# Patient Record
Sex: Female | Born: 1975 | Hispanic: No | Marital: Single | Smoking: Never smoker
Health system: Southern US, Community
[De-identification: ages and names within clinical notes are randomized; demographics above are authoritative.]

## PROBLEM LIST (undated history)

## (undated) DIAGNOSIS — K219 Gastro-esophageal reflux disease without esophagitis: Secondary | ICD-10-CM

## (undated) DIAGNOSIS — F419 Anxiety disorder, unspecified: Secondary | ICD-10-CM

## (undated) DIAGNOSIS — N200 Calculus of kidney: Secondary | ICD-10-CM

## (undated) DIAGNOSIS — Z87442 Personal history of urinary calculi: Secondary | ICD-10-CM

## (undated) DIAGNOSIS — Q615 Medullary cystic kidney: Secondary | ICD-10-CM

## (undated) DIAGNOSIS — Z8489 Family history of other specified conditions: Secondary | ICD-10-CM

## (undated) HISTORY — DX: Medullary cystic kidney: Q61.5

## (undated) HISTORY — PX: TUBAL LIGATION: SHX77

## (undated) HISTORY — DX: Calculus of kidney: N20.0

---

## 2010-12-21 ENCOUNTER — Ambulatory Visit: Payer: Self-pay

## 2010-12-21 ENCOUNTER — Other Ambulatory Visit: Payer: Self-pay | Admitting: Occupational Medicine

## 2010-12-21 DIAGNOSIS — R52 Pain, unspecified: Secondary | ICD-10-CM

## 2012-11-19 ENCOUNTER — Emergency Department (HOSPITAL_COMMUNITY)
Admission: EM | Admit: 2012-11-19 | Discharge: 2012-11-19 | Disposition: A | Discharge status: Home / Self Care | Payer: Managed Care, Other (non HMO) | Attending: Emergency Medicine | Admitting: Emergency Medicine

## 2012-11-19 ENCOUNTER — Emergency Department (HOSPITAL_COMMUNITY): Payer: Managed Care, Other (non HMO)

## 2012-11-19 ENCOUNTER — Encounter (HOSPITAL_COMMUNITY): Payer: Self-pay | Admitting: Emergency Medicine

## 2012-11-19 DIAGNOSIS — R0789 Other chest pain: Secondary | ICD-10-CM | POA: Insufficient documentation

## 2012-11-19 DIAGNOSIS — R079 Chest pain, unspecified: Secondary | ICD-10-CM

## 2012-11-19 DIAGNOSIS — Z79899 Other long term (current) drug therapy: Secondary | ICD-10-CM | POA: Insufficient documentation

## 2012-11-19 LAB — CBC
HCT: 38.3 % (ref 36.0–46.0)
Hemoglobin: 12.9 g/dL (ref 12.0–15.0)
MCH: 28.2 pg (ref 26.0–34.0)
MCHC: 33.7 g/dL (ref 30.0–36.0)
MCV: 83.8 fL (ref 78.0–100.0)
Platelets: 202 10*3/uL (ref 150–400)
RBC: 4.57 MIL/uL (ref 3.87–5.11)
RDW: 12.8 % (ref 11.5–15.5)
WBC: 8.6 10*3/uL (ref 4.0–10.5)

## 2012-11-19 LAB — COMPREHENSIVE METABOLIC PANEL
ALT: 21 U/L (ref 0–35)
AST: 14 U/L (ref 0–37)
Albumin: 3.3 g/dL — ABNORMAL LOW (ref 3.5–5.2)
Alkaline Phosphatase: 68 U/L (ref 39–117)
BUN: 10 mg/dL (ref 6–23)
CO2: 28 mEq/L (ref 19–32)
Calcium: 9.1 mg/dL (ref 8.4–10.5)
Chloride: 101 mEq/L (ref 96–112)
Creatinine, Ser: 0.74 mg/dL (ref 0.50–1.10)
GFR calc Af Amer: >90 mL/min (ref >90)
GFR calc non Af Amer: >90 mL/min (ref >90)
Glucose, Bld: 96 mg/dL (ref 70–99)
Potassium: 3.7 mEq/L (ref 3.5–5.1)
Sodium: 137 mEq/L (ref 135–145)
Total Bilirubin: 0.3 mg/dL (ref 0.3–1.2)
Total Protein: 6.8 g/dL (ref 6.0–8.3)

## 2012-11-19 LAB — PROTIME-INR
INR: 0.96 (ref 0.00–1.49)
Prothrombin Time: 12.6 seconds (ref 11.6–15.2)

## 2012-11-19 LAB — D-DIMER, QUANTITATIVE: D-Dimer, Quant: <0.27 ug/mL-FEU (ref 0.00–0.48)

## 2012-11-19 LAB — TROPONIN I: Troponin I: <0.3 ng/mL (ref <0.30)

## 2012-11-19 MED ORDER — NAPROXEN 500 MG PO TABS: 500.0000 mg | ORAL_TABLET | Freq: Two times a day (BID) | ORAL | Status: DC

## 2012-11-19 MED ORDER — MORPHINE SULFATE 4 MG/ML IJ SOLN
4.0000 mg | Freq: Once | INTRAMUSCULAR | Status: AC
  Administered 2012-11-19: 4 mg via INTRAVENOUS
  Filled 2012-11-19: qty 1

## 2012-11-19 MED ORDER — SODIUM CHLORIDE 0.9 % IV SOLN
1000.0000 mL | INTRAVENOUS | Status: DC
  Administered 2012-11-19: 1000 mL via INTRAVENOUS

## 2012-11-19 MED ORDER — LANSOPRAZOLE 30 MG PO TBDP: 30.0000 mg | ORAL_TABLET | Freq: Every day | ORAL | Status: DC

## 2012-11-19 MED ORDER — ASPIRIN 81 MG PO CHEW
324.0000 mg | CHEWABLE_TABLET | Freq: Once | ORAL | Status: AC
  Administered 2012-11-19: 324 mg via ORAL
  Filled 2012-11-19: qty 4

## 2012-11-19 NOTE — ED Notes (Signed)
Assisted pt to restroom  

## 2012-11-19 NOTE — ED Provider Notes (Signed)
CSN: [id number]     Arrival date & time 11/19/12  0740 History   First MD Initiated Contact with Patient 11/19/12 [phone]     Chief Complaint  Patient presents with  . Chest Pain   HPI Patient presents emergency room with complaints of chest soreness and tightness that started last evening. Patient initially thought it might be indigestion she she tried taking an antacid. This morning when she woke up the symptoms were still persistent. The pain seems to get worse when she takes a deep breath. Being upright versus supine seems to help a little bit as well.  She denies any nausea, vomiting, diarrhea. She has no complaints of abdominal pain. She has not had any trouble with leg swelling. She has not any fevers or cough. She does not feel short of breath. Patient does not have history of heart disease. She does not have any history of a minimal is more blood clots. She does take oral contraceptive pills.  History reviewed. No pertinent past medical history. Past Surgical History  Procedure Laterality Date  . Cesarean section     History reviewed. No pertinent family history. History  Substance Use Topics  . Smoking status: Never Smoker   . Smokeless tobacco: Not on file  . Alcohol Use: No   OB History   Grav Para Term Preterm Abortions TAB SAB Ect Mult Living                 Review of Systems  All other systems reviewed and are negative.    Allergies  Cephalosporins  Home Medications   Current Outpatient Rx  Name  Route  Sig  Dispense  Refill  . lansoprazole (PREVACID SOLUTAB) 30 MG disintegrating tablet   Oral   Take 1 tablet (30 mg total) by mouth daily.   14 tablet   1   . naproxen (NAPROSYN) 500 MG tablet   Oral   Take 1 tablet (500 mg total) by mouth 2 (two) times daily.   30 tablet   0    BP 129/76  Pulse 84  Temp(Src) 98.1 F (36.7 C) (Oral)  Resp 17  Ht 5\' 4"  (1.626 m)  Wt 195 lb (88.451 kg)  BMI 33.46 kg/m2  SpO2 98%  LMP 10/29/2012 Physical Exam   Nursing note and vitals reviewed. Constitutional: She appears well-developed and well-nourished. No distress.  HENT:  Head: Normocephalic and atraumatic.  Right Ear: External ear normal.  Left Ear: External ear normal.  Eyes: Conjunctivae are normal. Right eye exhibits no discharge. Left eye exhibits no discharge. No scleral icterus.  Neck: Neck supple. No tracheal deviation present.  Cardiovascular: Normal rate, regular rhythm and intact distal pulses.   Pulmonary/Chest: Effort normal and breath sounds normal. No stridor. No respiratory distress. She has no wheezes. She has no rales.  Abdominal: Soft. Bowel sounds are normal. She exhibits no distension. There is no tenderness. There is no rebound and no guarding.  Musculoskeletal: She exhibits no edema and no tenderness.  Neurological: She is alert. She has normal strength. No sensory deficit. Cranial nerve deficit:  no gross defecits noted. She exhibits normal muscle tone. She displays no seizure activity. Coordination normal.  Skin: Skin is warm and dry. No rash noted.  Psychiatric: She has a normal mood and affect.    ED Course  EKG Normal sinus rhythm with sinus arrhythmia rate 71 Normal axis Normal intervals Normal ST-T waves No prior EKG for comparison Procedures (including critical care time)  Labs  Reviewed  COMPREHENSIVE METABOLIC PANEL - Abnormal; Notable for the following:    Albumin 3.3 (*)    All other components within normal limits  CBC  PROTIME-INR  D-DIMER, QUANTITATIVE  TROPONIN I   Dg Chest 2 View  11/19/2012   *RADIOLOGY REPORT*  Clinical Data: Chest pain.  CHEST - 2 VIEW  Comparison: None.  Findings: Minimal peribronchial thickening probably normal rather than representing bronchitis type changes.  No infiltrate, congestive heart failure or pneumothorax.  Heart size within normal limits.  IMPRESSION: Minimal peribronchial thickening.   Original Report Authenticated By: Lacy Duverney, M.D.   1. Chest pain      MDM  Doubt pulmonary embolism. Patient is not tachycardic. She is not tachypneic. Her d-dimer is negative and a low risk patient.  Doubt ACS with her minimal risk factors, persistent symptoms since last evening and a normal EKG and normal troponin.  Pericarditis is a possibility although some of the features are atypical. Gastroesophageal reflux disease are also a possibility. We'll start the patient on NSAIDs and antacids. I recommend followup with her primary Dr. Warning signs and precautions were discussed.  Celene Kras, MD 11/19/12 [phone]

## 2012-11-19 NOTE — ED Notes (Signed)
Pain also worse with movement.

## 2012-11-19 NOTE — ED Notes (Signed)
Pt c/o all over chest tigtness since last night. Worse with deep breathing. Nondiaphoretic. deneis n/v/d. Denies dizziness. Nad.

## 2014-07-12 IMAGING — CR DG CHEST 2V
2 series · 2 of 2 positions shown · non-contrast
Comparison: None.

CLINICAL DATA: Chest pain.

CHEST - 2 VIEW

[view not recorded (1 of 2)]
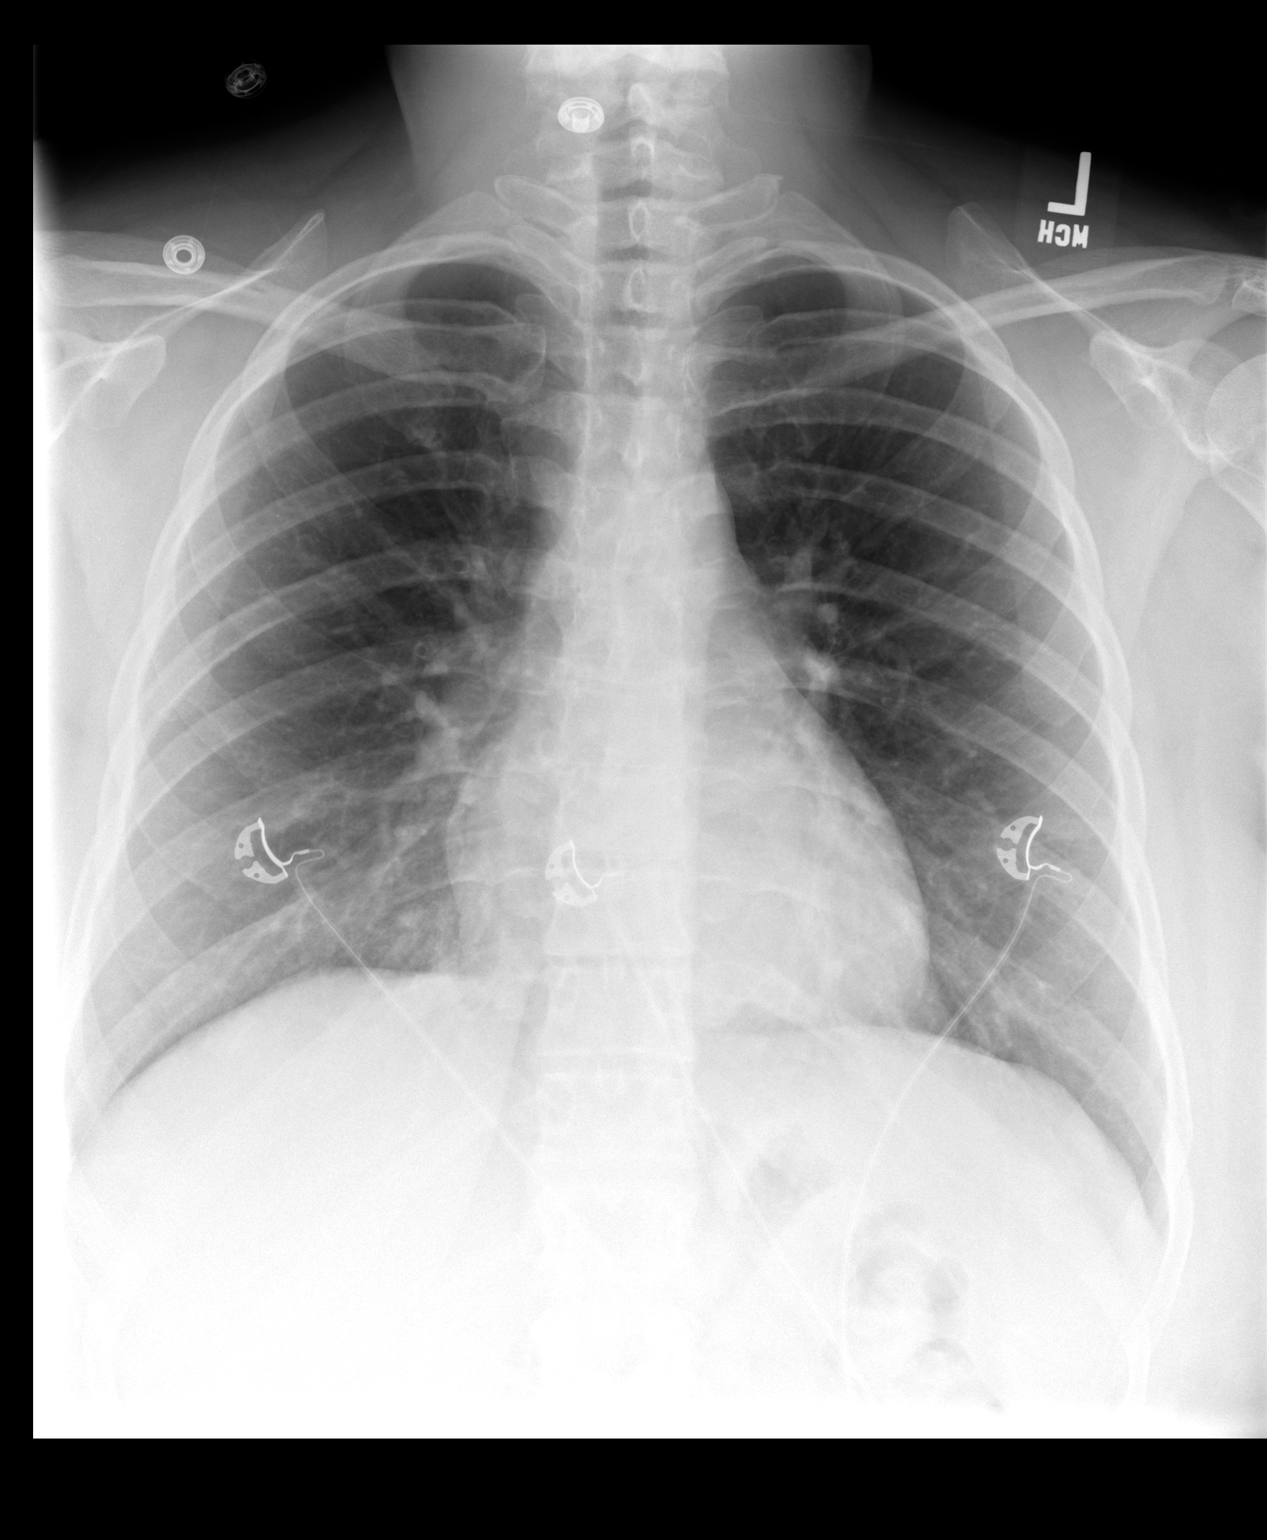

[view not recorded (2 of 2)]
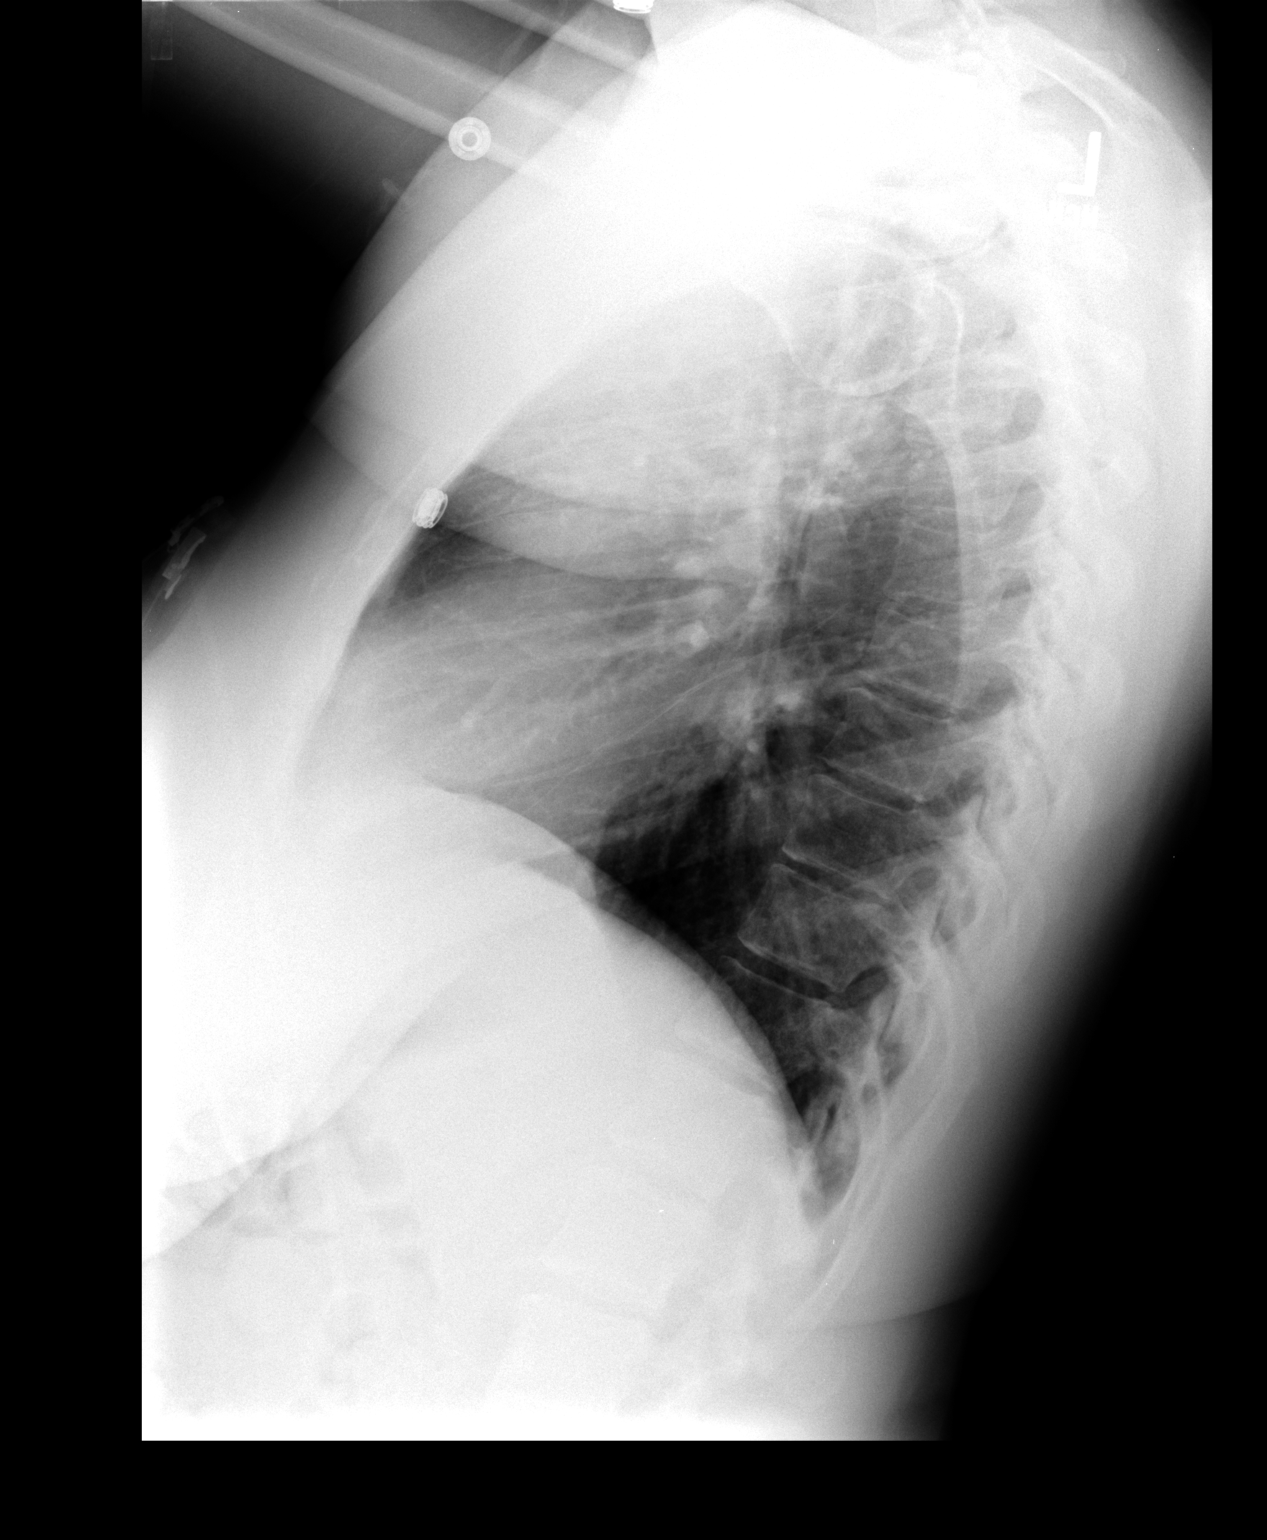

[2 of 2 positions shown; findings below may reference images not displayed]

FINDINGS: Minimal peribronchial thickening probably normal rather
than representing bronchitis type changes.

No infiltrate, congestive heart failure or pneumothorax..

Heart size within normal limits.
IMPRESSION: Minimal peribronchial thickening.

## 2015-10-11 DIAGNOSIS — Z1389 Encounter for screening for other disorder: Secondary | ICD-10-CM | POA: Diagnosis not present

## 2015-10-11 DIAGNOSIS — N342 Other urethritis: Secondary | ICD-10-CM | POA: Diagnosis not present

## 2015-10-11 DIAGNOSIS — R350 Frequency of micturition: Secondary | ICD-10-CM | POA: Diagnosis not present

## 2015-10-11 DIAGNOSIS — Z6835 Body mass index (BMI) 35.0-35.9, adult: Secondary | ICD-10-CM | POA: Diagnosis not present

## 2016-05-13 DIAGNOSIS — M545 Low back pain: Secondary | ICD-10-CM | POA: Diagnosis not present

## 2016-05-13 DIAGNOSIS — Z1389 Encounter for screening for other disorder: Secondary | ICD-10-CM | POA: Diagnosis not present

## 2016-05-13 DIAGNOSIS — Z6835 Body mass index (BMI) 35.0-35.9, adult: Secondary | ICD-10-CM | POA: Diagnosis not present

## 2016-05-13 DIAGNOSIS — N390 Urinary tract infection, site not specified: Secondary | ICD-10-CM | POA: Diagnosis not present

## 2016-05-13 DIAGNOSIS — R319 Hematuria, unspecified: Secondary | ICD-10-CM | POA: Diagnosis not present

## 2016-05-13 DIAGNOSIS — Z23 Encounter for immunization: Secondary | ICD-10-CM | POA: Diagnosis not present

## 2016-05-23 DIAGNOSIS — M5126 Other intervertebral disc displacement, lumbar region: Secondary | ICD-10-CM | POA: Diagnosis not present

## 2016-05-23 DIAGNOSIS — M4316 Spondylolisthesis, lumbar region: Secondary | ICD-10-CM | POA: Diagnosis not present

## 2016-05-23 DIAGNOSIS — R319 Hematuria, unspecified: Secondary | ICD-10-CM | POA: Diagnosis not present

## 2016-05-23 DIAGNOSIS — M2578 Osteophyte, vertebrae: Secondary | ICD-10-CM | POA: Diagnosis not present

## 2016-06-11 DIAGNOSIS — M5441 Lumbago with sciatica, right side: Secondary | ICD-10-CM | POA: Diagnosis not present

## 2016-06-11 DIAGNOSIS — Z6835 Body mass index (BMI) 35.0-35.9, adult: Secondary | ICD-10-CM | POA: Diagnosis not present

## 2016-10-08 DIAGNOSIS — M545 Low back pain: Secondary | ICD-10-CM | POA: Diagnosis not present

## 2016-10-08 DIAGNOSIS — S338XXA Sprain of other parts of lumbar spine and pelvis, initial encounter: Secondary | ICD-10-CM | POA: Diagnosis not present

## 2016-10-10 DIAGNOSIS — S338XXA Sprain of other parts of lumbar spine and pelvis, initial encounter: Secondary | ICD-10-CM | POA: Diagnosis not present

## 2016-10-10 DIAGNOSIS — M545 Low back pain: Secondary | ICD-10-CM | POA: Diagnosis not present

## 2016-10-31 DIAGNOSIS — S338XXA Sprain of other parts of lumbar spine and pelvis, initial encounter: Secondary | ICD-10-CM | POA: Diagnosis not present

## 2016-10-31 DIAGNOSIS — M545 Low back pain: Secondary | ICD-10-CM | POA: Diagnosis not present

## 2017-01-31 DIAGNOSIS — Z1389 Encounter for screening for other disorder: Secondary | ICD-10-CM | POA: Diagnosis not present

## 2017-01-31 DIAGNOSIS — Z6835 Body mass index (BMI) 35.0-35.9, adult: Secondary | ICD-10-CM | POA: Diagnosis not present

## 2017-01-31 DIAGNOSIS — M5410 Radiculopathy, site unspecified: Secondary | ICD-10-CM | POA: Diagnosis not present

## 2017-01-31 DIAGNOSIS — F4320 Adjustment disorder, unspecified: Secondary | ICD-10-CM | POA: Diagnosis not present

## 2017-01-31 DIAGNOSIS — E6609 Other obesity due to excess calories: Secondary | ICD-10-CM | POA: Diagnosis not present

## 2017-01-31 DIAGNOSIS — M545 Low back pain: Secondary | ICD-10-CM | POA: Diagnosis not present

## 2017-05-02 DIAGNOSIS — J069 Acute upper respiratory infection, unspecified: Secondary | ICD-10-CM | POA: Diagnosis not present

## 2017-05-02 DIAGNOSIS — Z6834 Body mass index (BMI) 34.0-34.9, adult: Secondary | ICD-10-CM | POA: Diagnosis not present

## 2017-05-02 DIAGNOSIS — J029 Acute pharyngitis, unspecified: Secondary | ICD-10-CM | POA: Diagnosis not present

## 2017-05-02 DIAGNOSIS — E6609 Other obesity due to excess calories: Secondary | ICD-10-CM | POA: Diagnosis not present

## 2018-01-16 DIAGNOSIS — R109 Unspecified abdominal pain: Secondary | ICD-10-CM | POA: Diagnosis not present

## 2018-01-16 DIAGNOSIS — N390 Urinary tract infection, site not specified: Secondary | ICD-10-CM | POA: Diagnosis not present

## 2018-01-16 DIAGNOSIS — N10 Acute tubulo-interstitial nephritis: Secondary | ICD-10-CM | POA: Diagnosis not present

## 2018-01-22 DIAGNOSIS — Z1389 Encounter for screening for other disorder: Secondary | ICD-10-CM | POA: Diagnosis not present

## 2018-01-22 DIAGNOSIS — Z6838 Body mass index (BMI) 38.0-38.9, adult: Secondary | ICD-10-CM | POA: Diagnosis not present

## 2018-01-22 DIAGNOSIS — Z23 Encounter for immunization: Secondary | ICD-10-CM | POA: Diagnosis not present

## 2018-01-22 DIAGNOSIS — N10 Acute tubulo-interstitial nephritis: Secondary | ICD-10-CM | POA: Diagnosis not present

## 2018-01-29 ENCOUNTER — Other Ambulatory Visit: Payer: Self-pay | Admitting: Family Medicine

## 2018-01-29 DIAGNOSIS — Z1231 Encounter for screening mammogram for malignant neoplasm of breast: Secondary | ICD-10-CM

## 2018-01-30 DIAGNOSIS — H40013 Open angle with borderline findings, low risk, bilateral: Secondary | ICD-10-CM | POA: Diagnosis not present

## 2018-09-23 DIAGNOSIS — N62 Hypertrophy of breast: Secondary | ICD-10-CM | POA: Diagnosis not present

## 2018-09-23 DIAGNOSIS — M549 Dorsalgia, unspecified: Secondary | ICD-10-CM | POA: Diagnosis not present

## 2018-09-23 DIAGNOSIS — Z1389 Encounter for screening for other disorder: Secondary | ICD-10-CM | POA: Diagnosis not present

## 2018-09-23 DIAGNOSIS — Z6839 Body mass index (BMI) 39.0-39.9, adult: Secondary | ICD-10-CM | POA: Diagnosis not present

## 2018-09-23 DIAGNOSIS — Z0000 Encounter for general adult medical examination without abnormal findings: Secondary | ICD-10-CM | POA: Diagnosis not present

## 2018-10-20 ENCOUNTER — Other Ambulatory Visit: Payer: Self-pay | Admitting: Obstetrics and Gynecology

## 2018-10-27 ENCOUNTER — Other Ambulatory Visit: Payer: Self-pay

## 2018-10-27 DIAGNOSIS — Z20822 Contact with and (suspected) exposure to covid-19: Secondary | ICD-10-CM

## 2018-10-28 ENCOUNTER — Other Ambulatory Visit: Payer: Self-pay | Admitting: Obstetrics and Gynecology

## 2018-10-29 LAB — NOVEL CORONAVIRUS, NAA: SARS-CoV-2, NAA: NOT DETECTED

## 2018-12-02 ENCOUNTER — Other Ambulatory Visit: Payer: Self-pay | Admitting: Obstetrics and Gynecology

## 2019-02-04 ENCOUNTER — Other Ambulatory Visit: Payer: Self-pay | Admitting: Obstetrics and Gynecology

## 2019-02-09 ENCOUNTER — Other Ambulatory Visit: Payer: Self-pay

## 2019-02-09 ENCOUNTER — Other Ambulatory Visit: Payer: Self-pay | Admitting: Obstetrics and Gynecology

## 2019-02-09 DIAGNOSIS — Z20822 Contact with and (suspected) exposure to covid-19: Secondary | ICD-10-CM

## 2019-02-11 LAB — NOVEL CORONAVIRUS, NAA: SARS-CoV-2, NAA: NOT DETECTED

## 2019-03-10 ENCOUNTER — Other Ambulatory Visit: Payer: Self-pay

## 2019-03-10 ENCOUNTER — Encounter: Payer: Self-pay | Admitting: Obstetrics and Gynecology

## 2019-03-10 ENCOUNTER — Other Ambulatory Visit (HOSPITAL_COMMUNITY)
Admission: RE | Admit: 2019-03-10 | Discharge: 2019-03-10 | Disposition: A | Discharge status: Home / Self Care | Payer: BC Managed Care – PPO | Source: Ambulatory Visit | Attending: Obstetrics and Gynecology | Admitting: Obstetrics and Gynecology

## 2019-03-10 ENCOUNTER — Ambulatory Visit (INDEPENDENT_AMBULATORY_CARE_PROVIDER_SITE_OTHER): Payer: BC Managed Care – PPO | Admitting: Obstetrics and Gynecology

## 2019-03-10 VITALS — BP 131/79 | HR 91 | Ht 63.0 in | Wt 238.0 lb

## 2019-03-10 DIAGNOSIS — Z01419 Encounter for gynecological examination (general) (routine) without abnormal findings: Secondary | ICD-10-CM

## 2019-03-10 NOTE — Progress Notes (Addendum)
Patient ID: Sheila Barrett, female   DOB: [birth year], 42 y.o.   MRN: [id number]  Assessment:  1. Annual Gyn Exam 2. Pt was given brochures about endometrial ablation and tubal sterilization Plan:  1. Pap smear done, next pap due 5 years 2. Return annually or prn 3    Annual mammogram advised after age 6 4. Schedule U/S for January Subjective:  Sheila Barrett is a 43 y.o. female No obstetric history on file. who presents for annual exam. Patient's last menstrual period was 12/27/2018 (exact date). The patient is looking for a permanent method of birth control. She has had an IUD which perforated her uterus after 3 years. Her periods are heavy and accompanied by migraines. She has 1 child delivered via c section and does not currently have a partner.  The following portions of the patient's history were reviewed and updated as appropriate: allergies, current medications, past family history, past medical history, past social history, past surgical history and problem list. History reviewed. No pertinent past medical history.  Past Surgical History:  Procedure Laterality Date  . CESAREAN SECTION       Current Outpatient Medications:  .  citalopram (CELEXA) 20 MG tablet, Take 20 mg by mouth daily., Disp: , Rfl:  .  esomeprazole (NEXIUM) 40 MG capsule, Take 40 mg by mouth daily at 12 noon., Disp: , Rfl:  .  ibuprofen (ADVIL) 800 MG tablet, Take 800 mg by mouth every 8 (eight) hours as needed., Disp: , Rfl:  .  levonorgestrel-ethinyl estradiol (SEASONALE) 0.15-0.03 MG tablet, Take 1 tablet by mouth daily., Disp: , Rfl:   Review of Systems Constitutional: negative Gastrointestinal: negative Genitourinary: negative  Objective:  BP 131/79 (BP Location: Left Arm, Patient Position: Sitting, Cuff Size: Normal)   Pulse 91   Ht 5\' 3"  (1.6 m)   Wt 238 lb (108 kg)   LMP 12/27/2018 (Exact Date)   BMI 42.16 kg/m    BMI: Body mass index is 42.16 kg/m.  General Appearance: Alert, appropriate  appearance for age. No acute distress HEENT: Grossly normal Neck / Thyroid:  Cardiovascular: RRR; normal S1, S2, no murmur Lungs: CTA bilaterally Back: No CVAT Breast Exam: Normal to inspection and No masses or nodes.No dimpling, nipple retraction or discharge. Even tissue. Gastrointestinal: Soft, non-tender, no masses or organomegaly Pelvic Exam: Cervix: tiny, normal appearing. Well supported Bladder: no pain with palpation Uterus: no pain with palpation Rectovaginal: not done Lymphatic Exam: Non-palpable nodes in neck, clavicular, axillary, or inguinal regions  Skin: no rash or abnormalities Neurologic: Normal gait and speech, no tremor  Psychiatric: Alert and oriented, appropriate affect.  Urinalysis:Not done  By signing my name below, I, De Burrs, attest that this documentation has been prepared under the direction and in the presence of Jonnie Kind, MD. Electronically Signed: De Burrs, Medical Scribe. 03/10/19. 4:01 PM.  I personally performed the services described in this documentation, which was SCRIBED in my presence. The recorded information has been reviewed and considered accurate. It has been edited as necessary during review. Jonnie Kind, MD

## 2019-03-11 DIAGNOSIS — R87610 Atypical squamous cells of undetermined significance on cytologic smear of cervix (ASC-US): Secondary | ICD-10-CM | POA: Diagnosis not present

## 2019-03-16 LAB — CYTOLOGY - PAP
Chlamydia: NEGATIVE
Comment: NEGATIVE
Comment: NEGATIVE
Comment: NORMAL
Diagnosis: UNDETERMINED — AB
High risk HPV: NEGATIVE
Neisseria Gonorrhea: NEGATIVE

## 2019-03-17 NOTE — Progress Notes (Signed)
With the minimal Pap smear abnormality noted of ASCUS, Atypical Squamous Cells of Undetermined Significance, it is important that the HPV test is negative. This is extremely reassuring. According to ASCCP the national organization of Clinical pathologists, tor your age, the risk of progression to a high grade, clinically worrisome lesion over the next 5 years is very low, calculated as 0.40%, less than one percent. Current guidelines are to recommend repeat testing in 3 yrs, and to test for HPV at that time.  I will ask our front office to put you on 3 year recall list.

## 2019-04-16 ENCOUNTER — Ambulatory Visit: Payer: BC Managed Care – PPO | Admitting: Obstetrics and Gynecology

## 2019-04-16 ENCOUNTER — Other Ambulatory Visit: Payer: BC Managed Care – PPO

## 2019-04-30 ENCOUNTER — Ambulatory Visit: Payer: BC Managed Care – PPO | Admitting: Obstetrics and Gynecology

## 2019-04-30 ENCOUNTER — Other Ambulatory Visit: Payer: BC Managed Care – PPO

## 2019-05-13 DIAGNOSIS — F329 Major depressive disorder, single episode, unspecified: Secondary | ICD-10-CM | POA: Diagnosis not present

## 2019-09-02 ENCOUNTER — Other Ambulatory Visit: Payer: Self-pay | Admitting: Obstetrics and Gynecology

## 2019-09-02 DIAGNOSIS — N920 Excessive and frequent menstruation with regular cycle: Secondary | ICD-10-CM

## 2019-09-03 ENCOUNTER — Other Ambulatory Visit: Payer: BC Managed Care – PPO

## 2019-09-03 ENCOUNTER — Ambulatory Visit: Payer: BC Managed Care – PPO | Admitting: Obstetrics and Gynecology

## 2019-09-29 ENCOUNTER — Ambulatory Visit: Payer: Managed Care, Other (non HMO) | Admitting: Obstetrics and Gynecology

## 2019-10-13 ENCOUNTER — Ambulatory Visit (INDEPENDENT_AMBULATORY_CARE_PROVIDER_SITE_OTHER): Payer: Managed Care, Other (non HMO)

## 2019-10-13 ENCOUNTER — Encounter: Payer: Self-pay | Admitting: Obstetrics and Gynecology

## 2019-10-13 ENCOUNTER — Ambulatory Visit: Payer: Managed Care, Other (non HMO) | Admitting: Obstetrics and Gynecology

## 2019-10-13 VITALS — BP 151/88 | HR 99 | Ht 63.0 in | Wt 238.6 lb

## 2019-10-13 DIAGNOSIS — N920 Excessive and frequent menstruation with regular cycle: Secondary | ICD-10-CM

## 2019-10-13 DIAGNOSIS — Z3009 Encounter for other general counseling and advice on contraception: Secondary | ICD-10-CM | POA: Diagnosis not present

## 2019-10-13 DIAGNOSIS — Z309 Encounter for contraceptive management, unspecified: Secondary | ICD-10-CM | POA: Insufficient documentation

## 2019-10-13 NOTE — Progress Notes (Signed)
PELVIC US TA/TV: homogeneous anteverted uterus,wnl,difficult to visualized endometrium boarders,EEC ?? 13.6 mm,normal ovaries,limited view of left ovary,right ovary appear mobile,no free fluid,no pain during ultrasound

## 2019-10-13 NOTE — Progress Notes (Signed)
Patient ID: Sheila Barrett, female   DOB: 12/14/1975, 44 y.o.   MRN: [id number]  Preoperative History and Physical  Sheila Barrett is a 44 y.o. G1P1001 here for surgical management of heavy periods and contraception. No significant preoperative concerns. Denies any vaginal itching. She has not used any medications in the vaginal area recently. She is on continuous BCPs and her last period was 4-5 months ago.  Proposed surgery: Ablation and tubal sterilization  Past Medical History:  Diagnosis Date  . Kidney stones    Past Surgical History:  Procedure Laterality Date  . CESAREAN SECTION     OB History  Gravida Para Term Preterm AB Living  1 1 1     1   SAB TAB Ectopic Multiple Live Births               # Outcome Date GA Lbr Len/2nd Weight Sex Delivery Anes PTL Lv  1 Term 09/27/05 [redacted]w[redacted]d    CS-LTranv     Patient denies any other pertinent gynecologic issues.   Current Outpatient Medications on File Prior to Visit  Medication Sig Dispense Refill  . acyclovir (ZOVIRAX) 400 MG tablet Take 400 mg by mouth 3 (three) times daily.    . citalopram (CELEXA) 20 MG tablet Take 20 mg by mouth daily.    [redacted]w[redacted]d esomeprazole (NEXIUM) 40 MG capsule Take 40 mg by mouth daily at 12 noon.    . Garlic 10 MG CAPS Take by mouth.    Marland Kitchen glucosamine-chondroitin 500-400 MG tablet Take 1 tablet by mouth 3 (three) times daily.    Marland Kitchen ibuprofen (ADVIL) 800 MG tablet Take 800 mg by mouth every 8 (eight) hours as needed.    Marland Kitchen levonorgestrel-ethinyl estradiol (SEASONALE) 0.15-0.03 MG tablet Take 1 tablet by mouth daily.     No current facility-administered medications on file prior to visit.   Allergies  Allergen Reactions  . Keflex [Cephalexin] Hives    Social History:   reports that she has never smoked. She has never used smokeless tobacco. She reports current alcohol use. She reports previous drug use.  Family History  Problem Relation Age of Onset  . Cancer Mother        lung    Review of Systems:  Noncontributory  PHYSICAL EXAM: Blood pressure (!) 151/88, pulse 99, height 5\' 3"  (1.6 m), weight 238 lb 9.6 oz (108.2 kg). General appearance - alert, well appearing, and in no distress Chest - clear to auscultation, no wheezes, rales or rhonchi, symmetric air entry Heart - normal rate and regular rhythm Abdomen - soft, nontender, nondistended, no masses or organomegaly Pelvic -  Vagina: clumpy discharge Uterus: small Cervix: good support Wet prep: normal secretions Extremities - peripheral pulses normal, no pedal edema, no clubbing or cyanosis  Labs: No results found for this or any previous visit (from the past 336 hour(s)).  Imaging Studies: Narrative & Impression  GYNECOLOGIC SONOGRAM   Sheila Barrett is a 44 y.o. G1P1001 unknown LMP,She is here for a pelvic sonogram for menorrhagia/pre op. For endometrial ablation and bilateral salpingectomy.   Uterus                      8.4 x 3.9 x 5.2 cm, Total uterine volume 90 cc, homogeneous anteverted uterus,wnl  Endometrium          13.6 mm, symmetrical, difficult to visualized endometrium boarders  Right ovary             1.4 x 1.2 x  1.6 cm, wnl  Left ovary                1.9 x 1.3 x 1.7 cm, wnl,limited view   No free fluid   Technician Comments:  PELVIC US TA/TV: homogeneous anteverted uterus,wnl,difficult to visualized endometrium boarders,EEC ?? 13.6 mm,normal ovaries,limited view of left ovary,right ovary appear mobile,no free fluid,no pain during ultrasound    [employer] 10/13/2019 4:18 PM   U/s reviewed, including photos of u/s , with the patient.  Assessment: There are no problems to display for this patient.  Discussion: 1. Discussed with pt risks and benefits of endometrial ablation,  At end of discussion, pt had opportunity to ask questions and has no further questions at this time.   Specific discussion of endometrial ablation as noted above. Greater than 50% was spent in counseling and  coordination of care with the patient.   Total time greater than: 30 minutes.   Plan: Reviewed ultrasound images with the patient.  Patient will undergo surgical management with Endometrial ablation and tubal sterilization by bilateral salpingectomy  By signing my name below, I, Pietro Cassis, attest that this documentation has been prepared under the direction and in the presence of Tilda Burrow, MD. Electronically Signed: Pietro Cassis, Medical Scribe. 10/13/19. 4:52 PM.  I personally performed the services described in this documentation, which was SCRIBED in my presence. The recorded information has been reviewed and considered accurate. It has been edited as necessary during review. Tilda Burrow, MD

## 2019-10-21 NOTE — Patient Instructions (Signed)
Sheila Barrett  10/21/2019     @   Your procedure is scheduled on  10/26/2019  Report to [facility] at  0745  A.M.  Call this number if you have problems the morning of surgery:  [phone]   Remember:  Do not eat or drink after midnight.                          Take these medicines the morning of surgery with A SIP OF WATER Celexa, prilosec.    Do not wear jewelry, make-up or nail polish.  Do not wear lotions, powders, or perfumes. Please wear deodorant and brush your teeth.  Do not shave 48 hours prior to surgery.  Men may shave face and neck.  Do not bring valuables to the hospital.  [facility] is not responsible for any belongings or valuables.  Contacts, dentures or bridgework may not be worn into surgery.  Leave your suitcase in the car.  After surgery it may be brought to your room.  For patients admitted to the hospital, discharge time will be determined by your treatment team.  Patients discharged the day of surgery will not be allowed to drive home.   Name and phone number of your driver:   family Special instructions:  DO NOT smoke the morning of your procedure.  Please read over the following fact sheets that you were given. Anesthesia Post-op Instructions and Care and Recovery After Surgery       Salpingectomy, Care After This sheet gives you information about how to care for yourself after your procedure. Your health care provider may also give you more specific instructions. If you have problems or questions, contact your health care provider. What can I expect after the procedure? After the procedure, it is common to have:  Pain in your abdomen.  Some light vaginal bleeding (spotting) for a few days.  Tiredness. Your recovery time will vary depending on which method your surgeon used for your surgery. Follow these instructions at home: Incision care   Follow instructions from your health care provider about how to  take care of your incisions. Make sure you: ? Wash your hands with soap and water before and after you change your bandage (dressing). If soap and water are not available, use hand sanitizer. ? Change or remove your dressing as told by your health care provider. ? Leave any stitches (sutures), skin glue, or adhesive strips in place. These skin closures may need to stay in place for 2 weeks or longer. If adhesive strip edges start to loosen and curl up, you may trim the loose edges. Do not remove adhesive strips completely unless your health care provider tells you to do that.  Keep your dressing clean and dry.  Check your incision area every day for signs of infection. Check for: ? Redness, swelling, or pain that gets worse. ? Fluid or blood. ? Warmth. ? Pus or a bad smell. Activity  Rest as told by your health care provider.  Avoid sitting for a long time without moving. Get up to take short walks every 1-2 hours. This is important to improve blood flow and breathing. Ask for help if you feel weak or unsteady.  Return to your normal activities as told by your health care provider. Ask your health care provider what activities are safe for you.  Do not drive until your health care provider says that  it is safe.  Do not lift anything that is heavier than 10 lb (4.5 kg), or the limit that you are told, until your health care provider says that it is safe. This may be 2-6 weeks depending on your surgery.  Until your health care provider approves: ? Do not douche. ? Do not use tampons. ? Do not have sex. Medicines  Take over-the-counter and prescription medicines only as told by your health care provider.  Ask your health care provider if the medicine prescribed to you: ? Requires you to avoid driving or using heavy machinery. ? Can cause constipation. You may need to take actions to prevent or treat constipation, such as:  Drink enough fluid to keep your urine pale yellow.  Take  over-the-counter or prescription medicines.  Eat foods that are high in fiber, such as beans, whole grains, and fresh fruits and vegetables.  Limit foods that are high in fat and processed sugars, such as fried or sweet foods. General instructions  Wear compression stockings as told by your health care provider. These stockings help to prevent blood clots and reduce swelling in your legs.  Do not use any products that contain nicotine or tobacco, such as cigarettes, e-cigarettes, and chewing tobacco. If you need help quitting, ask your health care provider.  Do not take baths, swim, or use a hot tub until your health care provider approves. You may take showers.  Keep all follow-up visits as told by your health care provider. This is important. Contact a health care provider if you have:  Pain when you urinate.  Redness, swelling, or pain around an incision.  Fluid or blood coming from an incision.  Pus or a bad smell coming from an incision.  An incision that feels warm to the touch.  A fever.  Abdominal pain that gets worse or does not get better with medicine.  An incision that starts to break open.  A rash.  Light-headedness.  Nausea and vomiting. Get help right away if you:  Have pain in your chest or leg.  Develop shortness of breath.  Faint.  Have increased or heavy vaginal bleeding, such as soaking a pad in an hour. Summary  After the procedure, it is common to feel tired, have some pain in your abdomen, and have some light vaginal bleeding for a few days.  Follow instructions from your health care provider about how to take care of your incisions.  Return to your normal activities as told by your health care provider. Ask your health care provider what activities are safe for you.  Do not douche, use tampons, or have sex until your health care provider approves.  Keep all follow-up visits as told by your health care provider. This information is not  intended to replace advice given to you by your health care provider. Make sure you discuss any questions you have with your health care provider. Document Revised: 03/09/2018 Document Reviewed: 03/09/2018 Elsevier Patient Education  2020 [employer] Hysteroscopy, Care After This sheet gives you information about how to care for yourself after your procedure. Your health care provider may also give you more specific instructions. If you have problems or questions, contact your health care provider. What can I expect after the procedure? After the procedure, it is common to have:  Cramping.  Bleeding. This can vary from light spotting to menstrual-like bleeding. Follow these instructions at home: Activity  Rest for 1-2 days after the procedure.  Do not douche, use tampons,  or have sex for 2 weeks after the procedure, or until your health care provider approves.  Do not drive for 24 hours after the procedure, or for as long as told by your health care provider.  Do not drive, use heavy machinery, or drink alcohol while taking prescription pain medicines. Medicines   Take over-the-counter and prescription medicines only as told by your health care provider.  Do not take aspirin during recovery. It can increase the risk of bleeding. General instructions  Do not take baths, swim, or use a hot tub until your health care provider approves. Take showers instead of baths for 2 weeks, or for as long as told by your health care provider.  To prevent or treat constipation while you are taking prescription pain medicine, your health care provider may recommend that you: ? Drink enough fluid to keep your urine clear or pale yellow. ? Take over-the-counter or prescription medicines. ? Eat foods that are high in fiber, such as fresh fruits and vegetables, whole grains, and beans. ? Limit foods that are high in fat and processed sugars, such as fried and sweet foods.  Keep all follow-up visits  as told by your health care provider. This is important. Contact a health care provider if:  You feel dizzy or lightheaded.  You feel nauseous.  You have abnormal vaginal discharge.  You have a rash.  You have pain that does not get better with medicine.  You have chills. Get help right away if:  You have bleeding that is heavier than a normal menstrual period.  You have a fever.  You have pain or cramps that get worse.  You develop new abdominal pain.  You faint.  You have pain in your shoulders.  You have shortness of breath. Summary  After the procedure, you may have cramping and some vaginal bleeding.  Do not douche, use tampons, or have sex for 2 weeks after the procedure, or until your health care provider approves.  Do not take baths, swim, or use a hot tub until your health care provider approves. Take showers instead of baths for 2 weeks, or for as long as told by your health care provider.  Report any unusual symptoms to your health care provider.  Keep all follow-up visits as told by your health care provider. This is important. This information is not intended to replace advice given to you by your health care provider. Make sure you discuss any questions you have with your health care provider. Document Revised: 02/28/2017 Document Reviewed: 04/16/2016 Elsevier Patient Education  2020 Else[employer] Inc.        Dilation and Curettage or Vacuum Curettage, Care After These instructions give you information about caring for yourself after your procedure. Your doctor may also give you more specific instructions. Call your doctor if you have any problems or questions after your procedure. Follow these instructions at home: Activity  Do not drive or use heavy machinery while taking prescription pain medicine.  For 24 hours after your procedure, avoid driving.  Take short walks often, followed by rest periods. Ask your doctor what activities are safe for  you. After one or two days, you may be able to return to your normal activities.  Do not lift anything that is heavier than 10 lb (4.5 kg) until your doctor approves.  For at least 2 weeks, or as long as told by your doctor: ? Do not douche. ? Do not use tampons. ? Do not have sex. General  instructions   Take over-the-counter and prescription medicines only as told by your doctor. This is very important if you take blood thinning medicine.  Do not take baths, swim, or use a hot tub until your doctor approves. Take showers instead of baths.  Wear compression stockings as told by your doctor.  It is up to you to get the results of your procedure. Ask your doctor when your results will be ready.  Keep all follow-up visits as told by your doctor. This is important. Contact a doctor if:  You have very bad cramps that get worse or do not get better with medicine.  You have very bad pain in your belly (abdomen).  You cannot drink fluids without throwing up (vomiting).  You get pain in a different part of the area between your belly and thighs (pelvis).  You have bad-smelling discharge from your vagina.  You have a rash. Get help right away if:  You are bleeding a lot from your vagina. A lot of bleeding means soaking more than one sanitary pad in an hour, for 2 hours in a row.  You have clumps of blood (blood clots) coming from your vagina.  You have a fever or chills.  Your belly feels very tender or hard.  You have chest pain.  You have trouble breathing.  You cough up blood.  You feel dizzy.  You feel light-headed.  You pass out (faint).  You have pain in your neck or shoulder area. Summary  Take short walks often, followed by rest periods. Ask your doctor what activities are safe for you. After one or two days, you may be able to return to your normal activities.  Do not lift anything that is heavier than 10 lb (4.5 kg) until your doctor approves.  Do not  take baths, swim, or use a hot tub until your doctor approves. Take showers instead of baths.  Contact your doctor if you have any symptoms of infection, like bad-smelling discharge from your vagina. This information is not intended to replace advice given to you by your health care provider. Make sure you discuss any questions you have with your health care provider. Document Revised: 02/28/2017 Document Reviewed: 12/04/2015 Elsevier Patient Education  2020 [employer] Inc.  General Anesthesia, Adult, Care After This sheet gives you information about how to care for yourself after your procedure. Your health care provider may also give you more specific instructions. If you have problems or questions, contact your health care provider. What can I expect after the procedure? After the procedure, the following side effects are common:  Pain or discomfort at the IV site.  Nausea.  Vomiting.  Sore throat.  Trouble concentrating.  Feeling cold or chills.  Weak or tired.  Sleepiness and fatigue.  Soreness and body aches. These side effects can affect parts of the body that were not involved in surgery. Follow these instructions at home:  For at least 24 hours after the procedure:  Have a responsible adult stay with you. It is important to have someone help care for you until you are awake and alert.  Rest as needed.  Do not: ? Participate in activities in which you could fall or become injured. ? Drive. ? Use heavy machinery. ? Drink alcohol. ? Take sleeping pills or medicines that cause drowsiness. ? Make important decisions or sign legal documents. ? Take care of children on your own. Eating and drinking  Follow any instructions from your health care provider about  eating or drinking restrictions.  When you feel hungry, start by eating small amounts of foods that are soft and easy to digest (bland), such as toast. Gradually return to your regular diet.  Drink enough  fluid to keep your urine pale yellow.  If you vomit, rehydrate by drinking water, juice, or clear broth. General instructions  If you have sleep apnea, surgery and certain medicines can increase your risk for breathing problems. Follow instructions from your health care provider about wearing your sleep device: ? Anytime you are sleeping, including during daytime naps. ? While taking prescription pain medicines, sleeping medicines, or medicines that make you drowsy.  Return to your normal activities as told by your health care provider. Ask your health care provider what activities are safe for you.  Take over-the-counter and prescription medicines only as told by your health care provider.  If you smoke, do not smoke without supervision.  Keep all follow-up visits as told by your health care provider. This is important. Contact a health care provider if:  You have nausea or vomiting that does not get better with medicine.  You cannot eat or drink without vomiting.  You have pain that does not get better with medicine.  You are unable to pass urine.  You develop a skin rash.  You have a fever.  You have redness around your IV site that gets worse. Get help right away if:  You have difficulty breathing.  You have chest pain.  You have blood in your urine or stool, or you vomit blood. Summary  After the procedure, it is common to have a sore throat or nausea. It is also common to feel tired.  Have a responsible adult stay with you for the first 24 hours after general anesthesia. It is important to have someone help care for you until you are awake and alert.  When you feel hungry, start by eating small amounts of foods that are soft and easy to digest (bland), such as toast. Gradually return to your regular diet.  Drink enough fluid to keep your urine pale yellow.  Return to your normal activities as told by your health care provider. Ask your health care provider what  activities are safe for you. This information is not intended to replace advice given to you by your health care provider. Make sure you discuss any questions you have with your health care provider. Document Revised: 03/21/2017 Document Reviewed: 11/01/2016 Elsevier Patient Education  2020 [employer]. How to Use Chlorhexidine for Bathing Chlorhexidine gluconate (CHG) is a germ-killing (antiseptic) solution that is used to clean the skin. It can get rid of the bacteria that normally live on the skin and can keep them away for about 24 hours. To clean your skin with CHG, you may be given:  A CHG solution to use in the shower or as part of a sponge bath.  A prepackaged cloth that contains CHG. Cleaning your skin with CHG may help lower the risk for infection:  While you are staying in the intensive care unit of the hospital.  If you have a vascular access, such as a central line, to provide short-term or long-term access to your veins.  If you have a catheter to drain urine from your bladder.  If you are on a ventilator. A ventilator is a machine that helps you breathe by moving air in and out of your lungs.  After surgery. What are the risks? Risks of using CHG include:  A  skin reaction.  Hearing loss, if CHG gets in your ears.  Eye injury, if CHG gets in your eyes and is not rinsed out.  The CHG product catching fire. Make sure that you avoid smoking and flames after applying CHG to your skin. Do not use CHG:  If you have a chlorhexidine allergy or have previously reacted to chlorhexidine.  On babies younger than 7 months of age. How to use CHG solution  Use CHG only as told by your health care provider, and follow the instructions on the label.  Use the full amount of CHG as directed. Usually, this is one bottle. During a shower Follow these steps when using CHG solution during a shower (unless your health care provider gives you different instructions): 1. Start the  shower. 2. Use your normal soap and shampoo to wash your face and hair. 3. Turn off the shower or move out of the shower stream. 4. Pour the CHG onto a clean washcloth. Do not use any type of brush or rough-edged sponge. 5. Starting at your neck, lather your body down to your toes. Make sure you follow these instructions: ? If you will be having surgery, pay special attention to the part of your body where you will be having surgery. Scrub this area for at least 1 minute. ? Do not use CHG on your head or face. If the solution gets into your ears or eyes, rinse them well with water. ? Avoid your genital area. ? Avoid any areas of skin that have broken skin, cuts, or scrapes. ? Scrub your back and under your arms. Make sure to wash skin folds. 6. Let the lather sit on your skin for 1-2 minutes or as long as told by your health care provider. 7. Thoroughly rinse your entire body in the shower. Make sure that all body creases and crevices are rinsed well. 8. Dry off with a clean towel. Do not put any substances on your body afterward--such as powder, lotion, or perfume--unless you are told to do so by your health care provider. Only use lotions that are recommended by the manufacturer. 9. Put on clean clothes or pajamas. 10. If it is the night before your surgery, sleep in clean sheets.  During a sponge bath Follow these steps when using CHG solution during a sponge bath (unless your health care provider gives you different instructions): 1. Use your normal soap and shampoo to wash your face and hair. 2. Pour the CHG onto a clean washcloth. 3. Starting at your neck, lather your body down to your toes. Make sure you follow these instructions: ? If you will be having surgery, pay special attention to the part of your body where you will be having surgery. Scrub this area for at least 1 minute. ? Do not use CHG on your head or face. If the solution gets into your ears or eyes, rinse them well with  water. ? Avoid your genital area. ? Avoid any areas of skin that have broken skin, cuts, or scrapes. ? Scrub your back and under your arms. Make sure to wash skin folds. 4. Let the lather sit on your skin for 1-2 minutes or as long as told by your health care provider. 5. Using a different clean, wet washcloth, thoroughly rinse your entire body. Make sure that all body creases and crevices are rinsed well. 6. Dry off with a clean towel. Do not put any substances on your body afterward--such as powder, lotion, or perfume--unless  you are told to do so by your health care provider. Only use lotions that are recommended by the manufacturer. 7. Put on clean clothes or pajamas. 8. If it is the night before your surgery, sleep in clean sheets. How to use CHG prepackaged cloths  Only use CHG cloths as told by your health care provider, and follow the instructions on the label.  Use the CHG cloth on clean, dry skin.  Do not use the CHG cloth on your head or face unless your health care provider tells you to.  When washing with the CHG cloth: ? Avoid your genital area. ? Avoid any areas of skin that have broken skin, cuts, or scrapes. Before surgery Follow these steps when using a CHG cloth to clean before surgery (unless your health care provider gives you different instructions): 1. Using the CHG cloth, vigorously scrub the part of your body where you will be having surgery. Scrub using a back-and-forth motion for 3 minutes. The area on your body should be completely wet with CHG when you are done scrubbing. 2. Do not rinse. Discard the cloth and let the area air-dry. Do not put any substances on the area afterward, such as powder, lotion, or perfume. 3. Put on clean clothes or pajamas. 4. If it is the night before your surgery, sleep in clean sheets.  For general bathing Follow these steps when using CHG cloths for general bathing (unless your health care provider gives you different  instructions). 1. Use a separate CHG cloth for each area of your body. Make sure you wash between any folds of skin and between your fingers and toes. Wash your body in the following order, switching to a new cloth after each step: ? The front of your neck, shoulders, and chest. ? Both of your arms, under your arms, and your hands. ? Your stomach and groin area, avoiding the genitals. ? Your right leg and foot. ? Your left leg and foot. ? The back of your neck, your back, and your buttocks. 2. Do not rinse. Discard the cloth and let the area air-dry. Do not put any substances on your body afterward--such as powder, lotion, or perfume--unless you are told to do so by your health care provider. Only use lotions that are recommended by the manufacturer. 3. Put on clean clothes or pajamas. Contact a health care provider if:  Your skin gets irritated after scrubbing.  You have questions about using your solution or cloth. Get help right away if:  Your eyes become very red or swollen.  Your eyes itch badly.  Your skin itches badly and is red or swollen.  Your hearing changes.  You have trouble seeing.  You have swelling or tingling in your mouth or throat.  You have trouble breathing.  You swallow any chlorhexidine. Summary  Chlorhexidine gluconate (CHG) is a germ-killing (antiseptic) solution that is used to clean the skin. Cleaning your skin with CHG may help to lower your risk for infection.  You may be given CHG to use for bathing. It may be in a bottle or in a prepackaged cloth to use on your skin. Carefully follow your health care provider's instructions and the instructions on the product label.  Do not use CHG if you have a chlorhexidine allergy.  Contact your health care provider if your skin gets irritated after scrubbing. This information is not intended to replace advice given to you by your health care provider. Make sure you discuss any questions you  have with your  health care provider. Document Revised: 06/04/2018 Document Reviewed: 02/13/2017 Elsevier Patient Education  2020 [employer]

## 2019-10-24 ENCOUNTER — Other Ambulatory Visit: Payer: Self-pay | Admitting: Obstetrics and Gynecology

## 2019-10-24 NOTE — H&P (View-Only) (Signed)
Patient ID: Sheila Barrett, female   DOB: 1975/10/15, 44 y.o.   MRN: [id number]  Preoperative History and Physical  Sheila Barrett is a 44 y.o. G1P1001 here for surgical management of heavy periods and contraception. No significant preoperative concerns. Denies any vaginal itching. She has not used any medications in the vaginal area recently. She is on continuous BCPs and her last period was 4-5 months ago.  Proposed surgery: Ablation and tubal sterilization      Past Medical History:  Diagnosis Date  . Kidney stones         Past Surgical History:  Procedure Laterality Date  . CESAREAN SECTION                     OB History  Gravida Para Term Preterm AB Living  1 1 1     1   SAB TAB Ectopic Multiple Live Births                     # Outcome Date GA Lbr Len/2nd Weight Sex Delivery Anes PTL Lv  1 Term 09/27/05 [redacted]w[redacted]d    CS-LTranv     Patient denies any other pertinent gynecologic issues.         Current Outpatient Medications on File Prior to Visit  Medication Sig Dispense Refill  . acyclovir (ZOVIRAX) 400 MG tablet Take 400 mg by mouth 3 (three) times daily.    . citalopram (CELEXA) 20 MG tablet Take 20 mg by mouth daily.    [redacted]w[redacted]d esomeprazole (NEXIUM) 40 MG capsule Take 40 mg by mouth daily at 12 noon.    . Garlic 10 MG CAPS Take by mouth.    Marland Kitchen glucosamine-chondroitin 500-400 MG tablet Take 1 tablet by mouth 3 (three) times daily.    Marland Kitchen ibuprofen (ADVIL) 800 MG tablet Take 800 mg by mouth every 8 (eight) hours as needed.    Marland Kitchen levonorgestrel-ethinyl estradiol (SEASONALE) 0.15-0.03 MG tablet Take 1 tablet by mouth daily.     No current facility-administered medications on file prior to visit.       Allergies  Allergen Reactions  . Keflex [Cephalexin] Hives    Social History:   reports that she has never smoked. She has never used smokeless tobacco. She reports current alcohol use. She reports previous drug use.       Family History   Problem Relation Age of Onset  . Cancer Mother        lung    Review of Systems: Noncontributory  PHYSICAL EXAM: Blood pressure (!) 151/88, pulse 99, height 5\' 3"  (1.6 m), weight 238 lb 9.6 oz (108.2 kg). General appearance - alert, well appearing, and in no distress Chest - clear to auscultation, no wheezes, rales or rhonchi, symmetric air entry Heart - normal rate and regular rhythm Abdomen - soft, nontender, nondistended, no masses or organomegaly Pelvic -  Vagina: clumpy discharge Uterus: small Cervix: good support Wet prep: normal secretions Extremities - peripheral pulses normal, no pedal edema, no clubbing or cyanosis  Labs: No results found for this or any previous visit (from the past 336 hour(s)).  Imaging Studies: Narrative & Impression  GYNECOLOGIC SONOGRAM   Sheila Barrett a 44 y.o.G1P1001 unknown LMP,She is here for a pelvic sonogram for menorrhagia/pre op. For endometrial ablation and bilateral salpingectomy.   Uterus 8.4 x 3.9 x 5.2 cm, Total uterine volume 90 cc, homogeneous anteverted uterus,wnl  Endometrium 13.6 mm, symmetrical, difficult to visualized endometrium boarders  Right ovary 1.4 x  1.2 x 1.6 cm, wnl  Left ovary 1.9 x 1.3 x 1.7 cm, wnl,limited view   No free fluid   Technician Comments:  PELVIC US TA/TV: homogeneous anteverted uterus,wnl,difficult to visualized endometrium boarders,EEC ?? 13.6 mm,normal ovaries,limited view of left ovary,right ovary appear mobile,no free fluid,no pain during ultrasound    [employer] 10/13/2019 4:18 PM   U/s reviewed, including photos of u/s , with the patient.  CBC    Component Value Date/Time   WBC 8.6 10/25/2019 0834   RBC 4.79 10/25/2019 0834   HGB 13.1 10/25/2019 0834   HCT 40.9 10/25/2019 0834   PLT 233 10/25/2019 0834   MCV 85.4 10/25/2019 0834   MCH 27.3 10/25/2019 0834   MCHC 32.0 10/25/2019 0834   RDW  14.1 10/25/2019 0834   . Assessment: There are no problems to display for this patient.  Discussion: 1. Discussed with pt risks and benefits of endometrial ablation,  At end of discussion, pt had opportunity to ask questions and has no further questions at this time.   Specific discussion of endometrial ablation as noted above. Greater than 50% was spent in counseling and coordination of care with the patient.   Total time greater than: 30 minutes.   Plan: Reviewed ultrasound images with the patient.  Patient will undergo surgical management with Endometrial ablation and tubal sterilization by bilateral salpingectomy  By signing my name below, I, Pietro Cassis, attest that this documentation has been prepared under the direction and in the presence of Tilda Burrow, MD. Electronically Signed: Pietro Cassis, Medical Scribe. 10/13/19. 4:52 PM.  I personally performed the services described in this documentation, which was SCRIBED in my presence. The recorded information has been reviewed and considered accurate. It has been edited as necessary during review. Tilda Burrow, MD

## 2019-10-24 NOTE — Progress Notes (Addendum)
Patient ID: Sheila Barrett, female   DOB: 1975/10/15, 44 y.o.   MRN: [id number]  Preoperative History and Physical  Sheila Barrett is a 44 y.o. G1P1001 here for surgical management of heavy periods and contraception. No significant preoperative concerns. Denies any vaginal itching. She has not used any medications in the vaginal area recently. She is on continuous BCPs and her last period was 4-5 months ago.  Proposed surgery: Ablation and tubal sterilization      Past Medical History:  Diagnosis Date  . Kidney stones         Past Surgical History:  Procedure Laterality Date  . CESAREAN SECTION                     OB History  Gravida Para Term Preterm AB Living  1 1 1     1   SAB TAB Ectopic Multiple Live Births                     # Outcome Date GA Lbr Len/2nd Weight Sex Delivery Anes PTL Lv  1 Term 09/27/05 [redacted]w[redacted]d    CS-LTranv     Patient denies any other pertinent gynecologic issues.         Current Outpatient Medications on File Prior to Visit  Medication Sig Dispense Refill  . acyclovir (ZOVIRAX) 400 MG tablet Take 400 mg by mouth 3 (three) times daily.    . citalopram (CELEXA) 20 MG tablet Take 20 mg by mouth daily.    [redacted]w[redacted]d esomeprazole (NEXIUM) 40 MG capsule Take 40 mg by mouth daily at 12 noon.    . Garlic 10 MG CAPS Take by mouth.    Marland Kitchen glucosamine-chondroitin 500-400 MG tablet Take 1 tablet by mouth 3 (three) times daily.    Marland Kitchen ibuprofen (ADVIL) 800 MG tablet Take 800 mg by mouth every 8 (eight) hours as needed.    Marland Kitchen levonorgestrel-ethinyl estradiol (SEASONALE) 0.15-0.03 MG tablet Take 1 tablet by mouth daily.     No current facility-administered medications on file prior to visit.       Allergies  Allergen Reactions  . Keflex [Cephalexin] Hives    Social History:   reports that she has never smoked. She has never used smokeless tobacco. She reports current alcohol use. She reports previous drug use.       Family History   Problem Relation Age of Onset  . Cancer Mother        lung    Review of Systems: Noncontributory  PHYSICAL EXAM: Blood pressure (!) 151/88, pulse 99, height 5\' 3"  (1.6 m), weight 238 lb 9.6 oz (108.2 kg). General appearance - alert, well appearing, and in no distress Chest - clear to auscultation, no wheezes, rales or rhonchi, symmetric air entry Heart - normal rate and regular rhythm Abdomen - soft, nontender, nondistended, no masses or organomegaly Pelvic -  Vagina: clumpy discharge Uterus: small Cervix: good support Wet prep: normal secretions Extremities - peripheral pulses normal, no pedal edema, no clubbing or cyanosis  Labs: No results found for this or any previous visit (from the past 336 hour(s)).  Imaging Studies: Narrative & Impression  GYNECOLOGIC SONOGRAM   Sheila Barrett a 44 y.o.G1P1001 unknown LMP,She is here for a pelvic sonogram for menorrhagia/pre op. For endometrial ablation and bilateral salpingectomy.   Uterus 8.4 x 3.9 x 5.2 cm, Total uterine volume 90 cc, homogeneous anteverted uterus,wnl  Endometrium 13.6 mm, symmetrical, difficult to visualized endometrium boarders  Right ovary 1.4 x  1.2 x 1.6 cm, wnl  Left ovary 1.9 x 1.3 x 1.7 cm, wnl,limited view   No free fluid   Technician Comments:  PELVIC US TA/TV: homogeneous anteverted uterus,wnl,difficult to visualized endometrium boarders,EEC ?? 13.6 mm,normal ovaries,limited view of left ovary,right ovary appear mobile,no free fluid,no pain during ultrasound    Amber J Carl 10/13/2019 4:18 PM   U/s reviewed, including photos of u/s , with the patient.  CBC    Component Value Date/Time   WBC 8.6 10/25/2019 0834   RBC 4.79 10/25/2019 0834   HGB 13.1 10/25/2019 0834   HCT 40.9 10/25/2019 0834   PLT 233 10/25/2019 0834   MCV 85.4 10/25/2019 0834   MCH 27.3 10/25/2019 0834   MCHC 32.0 10/25/2019 0834   RDW  14.1 10/25/2019 0834   . Assessment: There are no problems to display for this patient.  Discussion: 1. Discussed with pt risks and benefits of endometrial ablation,  At end of discussion, pt had opportunity to ask questions and has no further questions at this time.   Specific discussion of endometrial ablation as noted above. Greater than 50% was spent in counseling and coordination of care with the patient.   Total time greater than: 30 minutes.   Plan: Reviewed ultrasound images with the patient.  Patient will undergo surgical management with Endometrial ablation and tubal sterilization by bilateral salpingectomy  By signing my name below, I, Emily Tufford, attest that this documentation has been prepared under the direction and in the presence of Kalany Diekmann V, MD. Electronically Signed: Emily Tufford, Medical Scribe. 10/13/19. 4:52 PM.  I personally performed the services described in this documentation, which was SCRIBED in my presence. The recorded information has been reviewed and considered accurate. It has been edited as necessary during review. Bardia Wangerin V Jovanie Verge, MD       

## 2019-10-25 ENCOUNTER — Encounter (HOSPITAL_COMMUNITY): Payer: Self-pay

## 2019-10-25 ENCOUNTER — Other Ambulatory Visit (HOSPITAL_COMMUNITY)
Admission: RE | Admit: 2019-10-25 | Discharge: 2019-10-25 | Disposition: A | Discharge status: Home / Self Care | Payer: Managed Care, Other (non HMO) | Source: Ambulatory Visit | Attending: Obstetrics and Gynecology | Admitting: Obstetrics and Gynecology

## 2019-10-25 ENCOUNTER — Other Ambulatory Visit: Payer: Self-pay

## 2019-10-25 ENCOUNTER — Encounter (HOSPITAL_COMMUNITY)
Admission: RE | Admit: 2019-10-25 | Discharge: 2019-10-25 | Disposition: A | Discharge status: Home / Self Care | Payer: Managed Care, Other (non HMO) | Source: Ambulatory Visit | Attending: Obstetrics and Gynecology | Admitting: Obstetrics and Gynecology

## 2019-10-25 DIAGNOSIS — Z01812 Encounter for preprocedural laboratory examination: Secondary | ICD-10-CM | POA: Insufficient documentation

## 2019-10-25 DIAGNOSIS — Z20822 Contact with and (suspected) exposure to covid-19: Secondary | ICD-10-CM | POA: Diagnosis not present

## 2019-10-25 HISTORY — DX: Gastro-esophageal reflux disease without esophagitis: K21.9

## 2019-10-25 HISTORY — DX: Personal history of urinary calculi: Z87.442

## 2019-10-25 LAB — COMPREHENSIVE METABOLIC PANEL
ALT: 22 U/L (ref 0–44)
AST: 18 U/L (ref 15–41)
Albumin: 3.7 g/dL (ref 3.5–5.0)
Alkaline Phosphatase: 50 U/L (ref 38–126)
Anion gap: 11 (ref 5–15)
BUN: 11 mg/dL (ref 6–20)
CO2: 24 mmol/L (ref 22–32)
Calcium: 8.9 mg/dL (ref 8.9–10.3)
Chloride: 102 mmol/L (ref 98–111)
Creatinine, Ser: 0.7 mg/dL (ref 0.44–1.00)
GFR calc Af Amer: >60 mL/min (ref >60)
GFR calc non Af Amer: >60 mL/min (ref >60)
Glucose, Bld: 98 mg/dL (ref 70–99)
Potassium: 3.8 mmol/L (ref 3.5–5.1)
Sodium: 137 mmol/L (ref 135–145)
Total Bilirubin: 0.5 mg/dL (ref 0.3–1.2)
Total Protein: 6.8 g/dL (ref 6.5–8.1)

## 2019-10-25 LAB — SARS CORONAVIRUS 2 (TAT 6-24 HRS): SARS Coronavirus 2: NEGATIVE

## 2019-10-25 LAB — CBC
HCT: 40.9 % (ref 36.0–46.0)
Hemoglobin: 13.1 g/dL (ref 12.0–15.0)
MCH: 27.3 pg (ref 26.0–34.0)
MCHC: 32 g/dL (ref 30.0–36.0)
MCV: 85.4 fL (ref 80.0–100.0)
Platelets: 233 10*3/uL (ref 150–400)
RBC: 4.79 MIL/uL (ref 3.87–5.11)
RDW: 14.1 % (ref 11.5–15.5)
WBC: 8.6 10*3/uL (ref 4.0–10.5)
nRBC: 0 % (ref 0.0–0.2)

## 2019-10-25 LAB — HCG, SERUM, QUALITATIVE: Preg, Serum: NEGATIVE — AB

## 2019-10-26 ENCOUNTER — Ambulatory Visit (HOSPITAL_COMMUNITY): Payer: Managed Care, Other (non HMO) | Admitting: Anesthesiology

## 2019-10-26 ENCOUNTER — Encounter (HOSPITAL_COMMUNITY): Payer: Self-pay | Admitting: Obstetrics and Gynecology

## 2019-10-26 ENCOUNTER — Ambulatory Visit (HOSPITAL_COMMUNITY)
Admission: RE | Admit: 2019-10-26 | Discharge: 2019-10-26 | Disposition: A | Discharge status: Home / Self Care | Payer: Managed Care, Other (non HMO) | Attending: Obstetrics and Gynecology | Admitting: Obstetrics and Gynecology

## 2019-10-26 ENCOUNTER — Encounter (HOSPITAL_COMMUNITY): Payer: Self-pay | Admitting: Emergency Medicine

## 2019-10-26 ENCOUNTER — Encounter (HOSPITAL_COMMUNITY)
Admission: RE | Disposition: A | Discharge status: Home / Self Care | Payer: Self-pay | Source: Home / Self Care | Attending: Obstetrics and Gynecology

## 2019-10-26 DIAGNOSIS — Z302 Encounter for sterilization: Secondary | ICD-10-CM

## 2019-10-26 DIAGNOSIS — Z881 Allergy status to other antibiotic agents status: Secondary | ICD-10-CM | POA: Insufficient documentation

## 2019-10-26 DIAGNOSIS — Z79899 Other long term (current) drug therapy: Secondary | ICD-10-CM | POA: Diagnosis not present

## 2019-10-26 DIAGNOSIS — Z801 Family history of malignant neoplasm of trachea, bronchus and lung: Secondary | ICD-10-CM | POA: Diagnosis not present

## 2019-10-26 DIAGNOSIS — N920 Excessive and frequent menstruation with regular cycle: Secondary | ICD-10-CM

## 2019-10-26 DIAGNOSIS — K219 Gastro-esophageal reflux disease without esophagitis: Secondary | ICD-10-CM | POA: Diagnosis not present

## 2019-10-26 HISTORY — PX: LAPAROSCOPIC BILATERAL SALPINGECTOMY: SHX5889

## 2019-10-26 HISTORY — PX: HYSTEROSCOPY WITH D & C: SHX1775

## 2019-10-26 SURGERY — DILATATION AND CURETTAGE /HYSTEROSCOPY
Anesthesia: General | Site: Uterus

## 2019-10-26 MED ORDER — HYDROMORPHONE HCL 1 MG/ML IJ SOLN
0.2500 mg | INTRAMUSCULAR | Status: DC | PRN
  Administered 2019-10-26: 0.5 mg via INTRAVENOUS
  Filled 2019-10-26: qty 0.5

## 2019-10-26 MED ORDER — CIPROFLOXACIN IN D5W 400 MG/200ML IV SOLN
400.0000 mg | INTRAVENOUS | Status: AC
  Administered 2019-10-26: 400 mg via INTRAVENOUS

## 2019-10-26 MED ORDER — CHLORHEXIDINE GLUCONATE 0.12 % MT SOLN
15.0000 mL | Freq: Once | OROMUCOSAL | Status: AC
  Administered 2019-10-26: 15 mL via OROMUCOSAL

## 2019-10-26 MED ORDER — SODIUM CHLORIDE 0.9 % IR SOLN
Status: DC | PRN
  Administered 2019-10-26: 3000 mL

## 2019-10-26 MED ORDER — LIDOCAINE HCL (CARDIAC) PF 100 MG/5ML IV SOSY
PREFILLED_SYRINGE | INTRAVENOUS | Status: DC | PRN
  Administered 2019-10-26: 100 mg via INTRAVENOUS

## 2019-10-26 MED ORDER — MEPERIDINE HCL 50 MG/ML IJ SOLN: 6.2500 mg | INTRAMUSCULAR | Status: DC | PRN

## 2019-10-26 MED ORDER — ONDANSETRON HCL 4 MG/2ML IJ SOLN
INTRAMUSCULAR | Status: AC
  Filled 2019-10-26: qty 2

## 2019-10-26 MED ORDER — CIPROFLOXACIN IN D5W 400 MG/200ML IV SOLN
INTRAVENOUS | Status: AC
  Filled 2019-10-26: qty 200

## 2019-10-26 MED ORDER — ONDANSETRON HCL 4 MG/2ML IJ SOLN
INTRAMUSCULAR | Status: DC | PRN
  Administered 2019-10-26: 4 mg via INTRAVENOUS

## 2019-10-26 MED ORDER — BUPIVACAINE-EPINEPHRINE (PF) 0.5% -1:200000 IJ SOLN
INTRAMUSCULAR | Status: AC
  Filled 2019-10-26: qty 30

## 2019-10-26 MED ORDER — DEXAMETHASONE SODIUM PHOSPHATE 4 MG/ML IJ SOLN
INTRAMUSCULAR | Status: DC | PRN
  Administered 2019-10-26: 10 mg via INTRAVENOUS

## 2019-10-26 MED ORDER — ORAL CARE MOUTH RINSE: 15.0000 mL | Freq: Once | OROMUCOSAL | Status: AC

## 2019-10-26 MED ORDER — SUGAMMADEX SODIUM 200 MG/2ML IV SOLN
INTRAVENOUS | Status: DC | PRN
  Administered 2019-10-26: 220 mg via INTRAVENOUS

## 2019-10-26 MED ORDER — PROPOFOL 10 MG/ML IV BOLUS
INTRAVENOUS | Status: AC
  Filled 2019-10-26: qty 40

## 2019-10-26 MED ORDER — SEVOFLURANE IN SOLN
RESPIRATORY_TRACT | Status: AC
  Filled 2019-10-26: qty 250

## 2019-10-26 MED ORDER — ROCURONIUM BROMIDE 100 MG/10ML IV SOLN
INTRAVENOUS | Status: DC | PRN
  Administered 2019-10-26: 50 mg via INTRAVENOUS

## 2019-10-26 MED ORDER — BUPIVACAINE-EPINEPHRINE (PF) 0.5% -1:200000 IJ SOLN
INTRAMUSCULAR | Status: DC | PRN
  Administered 2019-10-26: 30 mL

## 2019-10-26 MED ORDER — METRONIDAZOLE IN NACL 5-0.79 MG/ML-% IV SOLN
INTRAVENOUS | Status: AC
  Filled 2019-10-26: qty 100

## 2019-10-26 MED ORDER — HYDROCODONE-ACETAMINOPHEN 5-325 MG PO TABS: 1.0000 | ORAL_TABLET | Freq: Four times a day (QID) | ORAL | 0 refills | Status: DC | PRN

## 2019-10-26 MED ORDER — FENTANYL CITRATE (PF) 250 MCG/5ML IJ SOLN
INTRAMUSCULAR | Status: AC
  Filled 2019-10-26: qty 5

## 2019-10-26 MED ORDER — DEXAMETHASONE SODIUM PHOSPHATE 10 MG/ML IJ SOLN
INTRAMUSCULAR | Status: AC
  Filled 2019-10-26: qty 1

## 2019-10-26 MED ORDER — EPHEDRINE 5 MG/ML INJ
INTRAVENOUS | Status: AC
  Filled 2019-10-26: qty 10

## 2019-10-26 MED ORDER — 0.9 % SODIUM CHLORIDE (POUR BTL) OPTIME
TOPICAL | Status: DC | PRN
  Administered 2019-10-26: 1000 mL

## 2019-10-26 MED ORDER — LACTATED RINGERS IV SOLN
Freq: Once | INTRAVENOUS | Status: AC
  Administered 2019-10-26: 1000 mL via INTRAVENOUS

## 2019-10-26 MED ORDER — FENTANYL CITRATE (PF) 100 MCG/2ML IJ SOLN
INTRAMUSCULAR | Status: DC | PRN
  Administered 2019-10-26: 50 ug via INTRAVENOUS
  Administered 2019-10-26: 100 ug via INTRAVENOUS
  Administered 2019-10-26: 50 ug via INTRAVENOUS

## 2019-10-26 MED ORDER — MIDAZOLAM HCL 2 MG/2ML IJ SOLN
INTRAMUSCULAR | Status: DC | PRN
  Administered 2019-10-26: 2 mg via INTRAVENOUS

## 2019-10-26 MED ORDER — EPHEDRINE SULFATE-NACL 50-0.9 MG/10ML-% IV SOSY
PREFILLED_SYRINGE | INTRAVENOUS | Status: DC | PRN
  Administered 2019-10-26: 5 mg via INTRAVENOUS

## 2019-10-26 MED ORDER — PROPOFOL 10 MG/ML IV BOLUS
INTRAVENOUS | Status: DC | PRN
  Administered 2019-10-26: 180 mg via INTRAVENOUS
  Administered 2019-10-26: 50 mg via INTRAVENOUS

## 2019-10-26 MED ORDER — PROMETHAZINE HCL 25 MG/ML IJ SOLN: 6.2500 mg | INTRAMUSCULAR | Status: DC | PRN

## 2019-10-26 MED ORDER — METRONIDAZOLE IN NACL 5-0.79 MG/ML-% IV SOLN
500.0000 mg | INTRAVENOUS | Status: AC
  Administered 2019-10-26: 500 mg via INTRAVENOUS

## 2019-10-26 MED ORDER — MIDAZOLAM HCL 2 MG/2ML IJ SOLN
INTRAMUSCULAR | Status: AC
  Filled 2019-10-26: qty 2

## 2019-10-26 MED ORDER — POVIDONE-IODINE 10 % EX SWAB: 2.0000 "application " | Freq: Once | CUTANEOUS | Status: DC

## 2019-10-26 SURGICAL SUPPLY — 47 items
BLADE SURG SZ11 CARB STEEL (BLADE) ×3 IMPLANT
BNDG ADH 1X3 FABRIC TAN LF (GAUZE/BANDAGES/DRESSINGS) ×9 IMPLANT
BNDG ADH 3X1 STRCH TAN LF (GAUZE/BANDAGES/DRESSINGS) ×6
CATH ROBINSON RED A/P 16FR (CATHETERS) IMPLANT
CLOSURE STERI-STRIP 1/4X4 (GAUZE/BANDAGES/DRESSINGS) ×3 IMPLANT
CLOTH BEACON ORANGE TIMEOUT ST (SAFETY) ×3 IMPLANT
COVER LIGHT HANDLE STERIS (MISCELLANEOUS) ×6 IMPLANT
COVER WAND RF STERILE (DRAPES) ×3 IMPLANT
DECANTER SPIKE VIAL GLASS SM (MISCELLANEOUS) ×3 IMPLANT
DURAPREP 26ML APPLICATOR (WOUND CARE) ×3 IMPLANT
ELECT REM PT RETURN 9FT ADLT (ELECTROSURGICAL) ×3
ELECTRODE REM PT RTRN 9FT ADLT (ELECTROSURGICAL) ×2 IMPLANT
GAUZE 4X4 16PLY RFD (DISPOSABLE) ×3 IMPLANT
GLOVE BIOGEL PI IND STRL 7.0 (GLOVE) ×6 IMPLANT
GLOVE BIOGEL PI IND STRL 9 (GLOVE) ×2 IMPLANT
GLOVE BIOGEL PI INDICATOR 7.0 (GLOVE) ×3
GLOVE BIOGEL PI INDICATOR 9 (GLOVE) ×1
GLOVE ECLIPSE 9.0 STRL (GLOVE) ×6 IMPLANT
GOWN SPEC L3 XXLG W/TWL (GOWN DISPOSABLE) ×3 IMPLANT
GOWN STRL REUS W/TWL LRG LVL3 (GOWN DISPOSABLE) ×3 IMPLANT
HANDPIECE ABLA MINERVA ENDO (MISCELLANEOUS) ×3 IMPLANT
INST SET HYSTEROSCOPY (KITS) ×3 IMPLANT
INST SET LAPROSCOPIC GYN AP (KITS) ×3 IMPLANT
IV NS IRRIG 3000ML ARTHROMATIC (IV SOLUTION) ×3 IMPLANT
KIT TURNOVER KIT A (KITS) ×3 IMPLANT
MANIFOLD NEPTUNE II (INSTRUMENTS) ×3 IMPLANT
NEEDLE HYPO 25X1 1.5 SAFETY (NEEDLE) ×3 IMPLANT
NEEDLE INSUFFLATION 120MM (ENDOMECHANICALS) ×3 IMPLANT
NS IRRIG 1000ML POUR BTL (IV SOLUTION) ×3 IMPLANT
PACK PERI GYN (CUSTOM PROCEDURE TRAY) ×3 IMPLANT
PAD ARMBOARD 7.5X6 YLW CONV (MISCELLANEOUS) ×3 IMPLANT
PAD TELFA 3X4 1S STER (GAUZE/BANDAGES/DRESSINGS) ×3 IMPLANT
SET BASIN LINEN APH (SET/KITS/TRAYS/PACK) ×3 IMPLANT
SET CYSTO W/LG BORE CLAMP LF (SET/KITS/TRAYS/PACK) ×3 IMPLANT
SET TUBE SMOKE EVAC HIGH FLOW (TUBING) ×3 IMPLANT
SHEARS HARMONIC ACE PLUS 36CM (ENDOMECHANICALS) ×3 IMPLANT
SLEEVE ENDOPATH XCEL 5M (ENDOMECHANICALS) ×3 IMPLANT
SOL ANTI FOG 6CC (MISCELLANEOUS) ×2 IMPLANT
SOLUTION ANTI FOG 6CC (MISCELLANEOUS) ×1
STRIP CLOSURE SKIN 1/4X3 (GAUZE/BANDAGES/DRESSINGS) ×3 IMPLANT
SUT VIC AB 4-0 PS2 27 (SUTURE) ×3 IMPLANT
SYR 10ML LL (SYRINGE) ×3 IMPLANT
SYR BULB IRRIG 60ML STRL (SYRINGE) ×3 IMPLANT
SYR CONTROL 10ML LL (SYRINGE) ×3 IMPLANT
TROCAR XCEL NON BLADE 8MM B8LT (ENDOMECHANICALS) ×3 IMPLANT
TROCAR XCEL NON-BLD 5MMX100MML (ENDOMECHANICALS) ×3 IMPLANT
WARMER LAPAROSCOPE (MISCELLANEOUS) ×3 IMPLANT

## 2019-10-26 NOTE — Anesthesia Procedure Notes (Signed)
Procedure Name: Intubation Date/Time: 10/26/2019 9:11 AM Performed by: Hewitt Blade, CRNA Pre-anesthesia Checklist: Patient identified, Emergency Drugs available, Suction available and Patient being monitored Patient Re-evaluated:Patient Re-evaluated prior to induction Oxygen Delivery Method: Circle system utilized Preoxygenation: Pre-oxygenation with 100% oxygen Induction Type: IV induction Ventilation: Mask ventilation without difficulty Laryngoscope Size: Mac and 3 Grade View: Grade I Tube type: Oral Tube size: 7.0 mm Number of attempts: 1 Airway Equipment and Method: Stylet and Oral airway Placement Confirmation: ETT inserted through vocal cords under direct vision,  positive ETCO2 and breath sounds checked- equal and bilateral Secured at: 21 cm Tube secured with: Tape Dental Injury: Teeth and Oropharynx as per pre-operative assessment

## 2019-10-26 NOTE — Discharge Instructions (Signed)
Laparoscopic Tubal Ligation, Care After This sheet gives you information about how to care for yourself after your procedure. Your health care provider may also give you more specific instructions. If you have problems or questions, contact your health care provider. What can I expect after the procedure? After the procedure, it is common to have:  A sore throat.  Discomfort in your shoulder.  Mild discomfort or cramping in your abdomen.  Gas pains.  Pain or soreness in the area where the surgical incision was made.  A bloated feeling.  Tiredness.  Nausea.  Vomiting. Follow these instructions at home: Medicines  Take over-the-counter and prescription medicines only as told by your health care provider.  Do not take aspirin because it can cause bleeding.  Ask your health care provider if the medicine prescribed to you: ? Requires you to avoid driving or using heavy machinery. ? Can cause constipation. You may need to take actions to prevent or treat constipation, such as:  Drink enough fluid to keep your urine pale yellow.  Take over-the-counter or prescription medicines.  Eat foods that are high in fiber, such as beans, whole grains, and fresh fruits and vegetables.  Limit foods that are high in fat and processed sugars, such as fried or sweet foods. Incision care      Follow instructions from your health care provider about how to take care of your incision. Make sure you: ? Wash your hands with soap and water before and after you change your bandage (dressing). If soap and water are not available, use hand sanitizer. ? Change your dressing as told by your health care provider. ? Leave stitches (sutures), skin glue, or adhesive strips in place. These skin closures may need to stay in place for 2 weeks or longer. If adhesive strip edges start to loosen and curl up, you may trim the loose edges. Do not remove adhesive strips completely unless your health care provider  tells you to do that.  Check your incision area every day for signs of infection. Check for: ? Redness, swelling, or pain. ? Fluid or blood. ? Warmth. ? Pus or a bad smell. Activity  Rest as told by your health care provider.  Avoid sitting for a long time without moving. Get up to take short walks every 1-2 hours. This is important to improve blood flow and breathing. Ask for help if you feel weak or unsteady.  Return to your normal activities as told by your health care provider. Ask your health care provider what activities are safe for you. General instructions  Do not take baths, swim, or use a hot tub until your health care provider approves. Ask your health care provider if you may take showers. You may only be allowed to take sponge baths.  Have someone help you with your daily household tasks for the first few days.  Keep all follow-up visits as told by your health care provider. This is important. Contact a health care provider if:  You have redness, swelling, or pain around your incision.  Your incision feels warm to the touch.  You have pus or a bad smell coming from your incision.  The edges of your incision break open after the sutures have been removed.  Your pain does not improve after 2-3 days.  You have a rash.  You repeatedly become dizzy or light-headed.  Your pain medicine is not helping. Get help right away if you:  Have a fever.  Faint.  Have increasing   pain in your abdomen.  Have severe pain in one or both of your shoulders.  Have fluid or blood coming from your sutures or from your vagina.  Have shortness of breath or difficulty breathing.  Have chest pain or leg pain.  Have ongoing nausea, vomiting, or diarrhea. Summary  After the procedure, it is common to have mild discomfort or cramping in your abdomen.  Take over-the-counter and prescription medicines only as told by your health care provider.  Watch for symptoms that should  prompt you to call your health care provider.  Keep all follow-up visits as told by your health care provider. This is important. This information is not intended to replace advice given to you by your health care provider. Make sure you discuss any questions you have with your health care provider. Document Revised: 08/25/2018 Document Reviewed: 02/10/2018 Elsevier Patient Education  2020 [employer]    Endometrial Ablation Endometrial ablation is a procedure that destroys the thin inner layer of the lining of the uterus (endometrium). This procedure may be done:  To stop heavy periods.  To stop bleeding that is causing anemia.  To control irregular bleeding.  To treat bleeding caused by small tumors (fibroids) in the endometrium. This procedure is often an alternative to major surgery, such as removal of the uterus and cervix (hysterectomy). As a result of this procedure:  You may not be able to have children. However, if you are premenopausal (you have not gone through menopause): ? You may still have a small chance of getting pregnant. ? You will need to use a reliable method of birth control after the procedure to prevent pregnancy.  You may stop having a menstrual period, or you may have only a small amount of bleeding during your period. Menstruation may return several years after the procedure. Tell a health care provider about:  Any allergies you have.  All medicines you are taking, including vitamins, herbs, eye drops, creams, and over-the-counter medicines.  Any problems you or family members have had with the use of anesthetic medicines.  Any blood disorders you have.  Any surgeries you have had.  Any medical conditions you have. What are the risks? Generally, this is a safe procedure. However, problems may occur, including:  A hole (perforation) in the uterus or bowel.  Infection of the uterus, bladder, or vagina.  Bleeding.  Damage to other structures  or organs.  An air bubble in the lung (air embolus).  Problems with pregnancy after the procedure.  Failure of the procedure.  Decreased ability to diagnose cancer in the endometrium. What happens before the procedure?  You will have tests of your endometrium to make sure there are no pre-cancerous cells or cancer cells present.  You may have an ultrasound of the uterus.  You may be given medicines to thin the endometrium.  Ask your health care provider about: ? Changing or stopping your regular medicines. This is especially important if you take diabetes medicines or blood thinners. ? Taking medicines such as aspirin and ibuprofen. These medicines can thin your blood. Do not take these medicines before your procedure if your doctor tells you not to.  Plan to have someone take you home from the hospital or clinic. What happens during the procedure?   You will lie on an exam table with your feet and legs supported as in a pelvic exam.  To lower your risk of infection: ? Your health care team will wash or sanitize their hands and   put on germ-free (sterile) gloves. ? Your genital area will be washed with soap.  An IV tube will be inserted into one of your veins.  You will be given a medicine to help you relax (sedative).  A surgical instrument with a light and camera (resectoscope) will be inserted into your vagina and moved into your uterus. This allows your surgeon to see inside your uterus.  Endometrial tissue will be removed using one of the following methods: ? Radiofrequency. This method uses a radiofrequency-alternating electric current to remove the endometrium. ? Cryotherapy. This method uses extreme cold to freeze the endometrium. ? Heated-free liquid. This method uses a heated saltwater (saline) solution to remove the endometrium. ? Microwave. This method uses high-energy microwaves to heat up the endometrium and remove it. ? Thermal balloon. This method involves  inserting a catheter with a balloon tip into the uterus. The balloon tip is filled with heated fluid to remove the endometrium. The procedure may vary among health care providers and hospitals. What happens after the procedure?  Your blood pressure, heart rate, breathing rate, and blood oxygen level will be monitored until the medicines you were given have worn off.  As tissue healing occurs, you may notice vaginal bleeding for 4-6 weeks after the procedure. You may also experience: ? Cramps. ? Thin, watery vaginal discharge that is light pink or brown in color. ? A need to urinate more frequently than usual. ? Nausea.  Do not drive for 24 hours if you were given a sedative.  Do not have sex or insert anything into your vagina until your health care provider approves. Summary  Endometrial ablation is done to treat the many causes of heavy menstrual bleeding.  The procedure may be done only after medications have been tried to control the bleeding.  Plan to have someone take you home from the hospital or clinic. This information is not intended to replace advice given to you by your health care provider. Make sure you discuss any questions you have with your health care provider. Document Revised: 09/02/2017 Document Reviewed: 04/04/2016 Elsevier Patient Education  2020 [employer]  

## 2019-10-26 NOTE — Op Note (Signed)
10/26/2019  10:29 AM  PATIENT:  Sheila Barrett  44 y.o. female  PRE-OPERATIVE DIAGNOSIS:  Menorrhagia Contraception  POST-OPERATIVE DIAGNOSIS:  Menorrhagia Contraception  PROCEDURE:  Procedure(s): DILATATION AND CURETTAGE /HYSTEROSCOPY/ABLATION WITH MINERVA (N/A) LAPAROSCOPIC BILATERAL SALPINGECTOMY (Bilateral)  SURGEON:  Surgeon(s) and Role:    Tilda Burrow, MD - Primary  PHYSICIAN ASSISTANT:   ASSISTANTS: none   ANESTHESIA:   local, general and paracervical block  EBL:  5 mL   BLOOD ADMINISTERED:none  DRAINS: none   LOCAL MEDICATIONS USED:  MARCAINE    and Amount: 30 ml  SPECIMEN:  Source of Specimen:  Endometrial curettage, endocervical sample (mucus)  DISPOSITION OF SPECIMEN:  PATHOLOGY  COUNTS:  YES  TOURNIQUET:  * No tourniquets in log *  DICTATION: .Dragon Dictation  PLAN OF CARE: Discharge to home after PACU  PATIENT DISPOSITION:  PACU - hemodynamically stable.   Delay start of Pharmacological VTE agent (>24hrs) due to surgical blood loss or risk of bleeding: not applicable Details of procedure: Patient was taken the operating room prepped and draped for combined abdominal and vaginal procedure with legs in low lithotomy support.  Abdomen and perineum were prepped and draped as well as the vagina with in and out catheterization performed.  Timeout was conducted and procedure confirmed by surgical team.  Ancef was not used but Flagyl and Cleocin were used for prophylaxis. An infraumbilical vertical 1 cm incision incision was made in the vertical axis, with sharp dissection down to the fascia.  Veress needle was used to pneumoperitoneum under 10 mm initial pressure.  The patient quickly fell to 15 mm intra-abdominal pressure due to her obesity and generous body fat .  And upon reaching 2.2 L carbon oxide at 15 mm of the insufflation was discontinued.  The 8 mm laparoscopic trocar was then introduced through the umbilicus under direct visualization and  entry of the abdominal cavity was without difficulty.  The suprapubic 8 mm trocar was then inserted under direct visualization as well as the right lower quadrant 5 mm trocar attention was then directed to the suprapubic area where some omental adhesions to the old anterior peritoneal wall cesarean section scar were sharply dissected free this allowed visualization of the uterine fundus and the fallopian tubes.  There was not a lot of extra space due to the obesity and generous fatty tissues in the abdominal cavity area. Adequate visualization could be achieved.  The ovaries were normal visually.  Fallopian tubes could be visualized.  There were some adhesions to the anterior bladder flap with the uterus held anteriorly by some thin adhesions which were left in situ. .  Attention was directed to the left adnexa where the fallopian tube could be elevated and using the harmonic Ace 7 coagulation device we were able to take them fallopian tube off of the mesosalpinx and transect the fallopian tube at the uterine cornu.  Specimen was extracted through the 8 mm trocar site.  The right fallopian tube was treated in a similar fashion and the pedicles were hemostatic.  30 cc of saline was instilled into the abdomen to assist with deflation of the abdomen and removal of the carbon oxide.  The devices were removed, and subcuticular 4-0 Vicryl closure of 3 skin incisions were performed. Endometrial ablation.  At this time the surgeon repositioned to the vaginal area in a seated position and second portion of the procedure was then performed.  Speculum was inserted.  The cervix grasped anteriorly with a single-tooth tenaculum, paracervical  block applied with 20 cc of saline used.  Of note 10 cc of been used on the skin incisions on the umbilicus earlier.  This was a total of 30 cc for the case.  The cervix was sounded to 10 cm in the anteflexed position.  There was dilation to 23 [place] that followed allowing introduction  of the 30 degree operative hysteroscope which showed essentially normal endometrial cavity with fallopian tubes identifiable by the ostia.  Brief curettage was performed.  There was a huge amount of thick cervical mucus but minimal amount of endometrial tissue.  The Minerva endometrial ablation device was then set for 4 cm uterine cavity length, due to the scarring at the lower uterine segment was wished to avoid incorporating in the ablation.  And after confirming the integrity of the device and the uterus, the 2-minute endometrial ablation sequence was activated and completed without difficulty.  The instruments were removed Steri-Strips applied as necessary on the abdomen and patient recovery room in good condition

## 2019-10-26 NOTE — Anesthesia Preprocedure Evaluation (Signed)
Anesthesia Evaluation  Patient identified by MRN, date of birth, ID band Patient awake    Reviewed: Allergy & Precautions, NPO status , Patient's Chart, lab work & pertinent test results  History of Anesthesia Complications Negative for: history of anesthetic complications  Airway Mallampati: II  TM Distance: >3 FB Neck ROM: Full    Dental  (+) Dental Advisory Given Crowns :   Pulmonary neg pulmonary ROS,    Pulmonary exam normal breath sounds clear to auscultation       Cardiovascular Exercise Tolerance: Good Normal cardiovascular exam Rhythm:Regular Rate:Normal     Neuro/Psych negative neurological ROS  negative psych ROS   GI/Hepatic GERD  Medicated and Controlled,(+)     substance abuse  alcohol use,   Endo/Other  negative endocrine ROS  Renal/GU Renal disease  negative genitourinary   Musculoskeletal negative musculoskeletal ROS (+)   Abdominal   Peds negative pediatric ROS (+)  Hematology negative hematology ROS (+)   Anesthesia Other Findings   Reproductive/Obstetrics negative OB ROS                             Anesthesia Physical Anesthesia Plan  ASA: II  Anesthesia Plan: General   Post-op Pain Management:    Induction: Intravenous  PONV Risk Score and Plan: 4 or greater and Ondansetron, Dexamethasone and Midazolam  Airway Management Planned: Oral ETT  Additional Equipment:   Intra-op Plan:   Post-operative Plan: Extubation in OR  Informed Consent: I have reviewed the patients History and Physical, chart, labs and discussed the procedure including the risks, benefits and alternatives for the proposed anesthesia with the patient or authorized representative who has indicated his/her understanding and acceptance.     Dental advisory given  Plan Discussed with: CRNA and Surgeon  Anesthesia Plan Comments:         Anesthesia Quick Evaluation

## 2019-10-26 NOTE — Transfer of Care (Signed)
Immediate Anesthesia Transfer of Care Note  Patient: Tynleigh Birt  Procedure(s) Performed: DILATATION AND CURETTAGE /HYSTEROSCOPY/ABLATION WITH MINERVA (N/A Uterus) LAPAROSCOPIC BILATERAL SALPINGECTOMY (Bilateral Abdomen)  Patient Location: PACU  Anesthesia Type:General  Level of Consciousness: awake, alert  and oriented  Airway & Oxygen Therapy: Patient Spontanous Breathing  Post-op Assessment: Report given to RN and Post -op Vital signs reviewed and stable  Post vital signs: Reviewed and stable  Last Vitals:  Vitals Value Taken Time  BP 104/43 10/26/19 1034  Temp    Pulse 96 10/26/19 1045  Resp 21 10/26/19 1045  SpO2 92 % 10/26/19 1045  Vitals shown include unvalidated device data.  Last Pain:  Vitals:   10/26/19 0807  TempSrc: Oral  PainSc: 0-No pain      Patients Stated Pain Goal: 8 (10/26/19 2878)  Complications: No complications documented.

## 2019-10-26 NOTE — Anesthesia Postprocedure Evaluation (Signed)
Anesthesia Post Note  Patient: Sheila Barrett  Procedure(s) Performed: DILATATION AND CURETTAGE /HYSTEROSCOPY/ABLATION WITH MINERVA (N/A Uterus) LAPAROSCOPIC BILATERAL SALPINGECTOMY (Bilateral Abdomen)  Patient location during evaluation: PACU Anesthesia Type: General Level of consciousness: awake and alert Pain management: pain level controlled Vital Signs Assessment: post-procedure vital signs reviewed and stable Respiratory status: spontaneous breathing Cardiovascular status: stable Postop Assessment: no apparent nausea or vomiting Anesthetic complications: no   No complications documented.   Last Vitals:  Vitals:   10/26/19 1130 10/26/19 1143  BP: 122/75 (!) 126/87  Pulse: 95 97  Resp: 17 18  Temp:  37.1 C  SpO2: 96% 98%    Last Pain:  Vitals:   10/26/19 1143  TempSrc: Oral  PainSc: 3                  Malinova,Nataliya Hristova

## 2019-10-26 NOTE — Interval H&P Note (Signed)
History and Physical Interval Note:  10/26/2019 8:42 AM  Sheila Barrett  has presented today for surgery, with the diagnosis of Menorrhagia Contraception.  The various methods of treatment have been discussed with the patient and family. After consideration of risks, benefits and other options for treatment, the patient has consented to  Procedure(s): DILATATION AND CURETTAGE /HYSTEROSCOPY (N/A) LAPAROSCOPIC BILATERAL SALPINGECTOMY (Bilateral) as a surgical intervention.  The patient's history has been reviewed, patient examined, no change in status, stable for surgery.  I have reviewed the patient's chart and labs.  Questions were answered to the patient's satisfaction.   CBC    Component Value Date/Time   WBC 8.6 10/25/2019 0834   RBC 4.79 10/25/2019 0834   HGB 13.1 10/25/2019 0834   HCT 40.9 10/25/2019 0834   PLT 233 10/25/2019 0834   MCV 85.4 10/25/2019 0834   MCH 27.3 10/25/2019 0834   MCHC 32.0 10/25/2019 0834   RDW 14.1 10/25/2019 0834     Tilda Burrow

## 2019-10-26 NOTE — Brief Op Note (Signed)
10/26/2019  10:29 AM  PATIENT:  Sheila Barrett  44 y.o. female  PRE-OPERATIVE DIAGNOSIS:  Menorrhagia Contraception  POST-OPERATIVE DIAGNOSIS:  Menorrhagia Contraception  PROCEDURE:  Procedure(s): DILATATION AND CURETTAGE /HYSTEROSCOPY/ABLATION WITH MINERVA (N/A) LAPAROSCOPIC BILATERAL SALPINGECTOMY (Bilateral)  SURGEON:  Surgeon(s) and Role:    * Kru Allman V, MD - Primary  PHYSICIAN ASSISTANT:   ASSISTANTS: none   ANESTHESIA:   local, general and paracervical block  EBL:  5 mL   BLOOD ADMINISTERED:none  DRAINS: none   LOCAL MEDICATIONS USED:  MARCAINE    and Amount: 30 ml  SPECIMEN:  Source of Specimen:  Endometrial curettage, endocervical sample (mucus)  DISPOSITION OF SPECIMEN:  PATHOLOGY  COUNTS:  YES  TOURNIQUET:  * No tourniquets in log *  DICTATION: .Dragon Dictation  PLAN OF CARE: Discharge to home after PACU  PATIENT DISPOSITION:  PACU - hemodynamically stable.   Delay start of Pharmacological VTE agent (>24hrs) due to surgical blood loss or risk of bleeding: not applicable Details of procedure: Patient was taken the operating room prepped and draped for combined abdominal and vaginal procedure with legs in low lithotomy support.  Abdomen and perineum were prepped and draped as well as the vagina with in and out catheterization performed.  Timeout was conducted and procedure confirmed by surgical team.  Ancef was not used but Flagyl and Cleocin were used for prophylaxis. An infraumbilical vertical 1 cm incision incision was made in the vertical axis, with sharp dissection down to the fascia.  Veress needle was used to pneumoperitoneum under 10 mm initial pressure.  The patient quickly fell to 15 mm intra-abdominal pressure due to her obesity and generous body fat .  And upon reaching 2.2 L carbon oxide at 15 mm of the insufflation was discontinued.  The 8 mm laparoscopic trocar was then introduced through the umbilicus under direct visualization and  entry of the abdominal cavity was without difficulty.  The suprapubic 8 mm trocar was then inserted under direct visualization as well as the right lower quadrant 5 mm trocar attention was then directed to the suprapubic area where some omental adhesions to the old anterior peritoneal wall cesarean section scar were sharply dissected free this allowed visualization of the uterine fundus and the fallopian tubes.  There was not a lot of extra space due to the obesity and generous fatty tissues in the abdominal cavity area. Adequate visualization could be achieved.  The ovaries were normal visually.  Fallopian tubes could be visualized.  There were some adhesions to the anterior bladder flap with the uterus held anteriorly by some thin adhesions which were left in situ. .  Attention was directed to the left adnexa where the fallopian tube could be elevated and using the harmonic Ace 7 coagulation device we were able to take them fallopian tube off of the mesosalpinx and transect the fallopian tube at the uterine cornu.  Specimen was extracted through the 8 mm trocar site.  The right fallopian tube was treated in a similar fashion and the pedicles were hemostatic.  30 cc of saline was instilled into the abdomen to assist with deflation of the abdomen and removal of the carbon oxide.  The devices were removed, and subcuticular 4-0 Vicryl closure of 3 skin incisions were performed. Endometrial ablation.  At this time the surgeon repositioned to the vaginal area in a seated position and second portion of the procedure was then performed.  Speculum was inserted.  The cervix grasped anteriorly with a single-tooth tenaculum, paracervical   block applied with 20 cc of saline used.  Of note 10 cc of been used on the skin incisions on the umbilicus earlier.  This was a total of 30 cc for the case.  The cervix was sounded to 10 cm in the anteflexed position.  There was dilation to 23 [place] that followed allowing introduction  of the 30 degree operative hysteroscope which showed essentially normal endometrial cavity with fallopian tubes identifiable by the ostia.  Brief curettage was performed.  There was a huge amount of thick cervical mucus but minimal amount of endometrial tissue.  The Minerva endometrial ablation device was then set for 4 cm uterine cavity length, due to the scarring at the lower uterine segment was wished to avoid incorporating in the ablation.  And after confirming the integrity of the device and the uterus, the 2-minute endometrial ablation sequence was activated and completed without difficulty.  The instruments were removed Steri-Strips applied as necessary on the abdomen and patient recovery room in good condition   

## 2019-10-27 ENCOUNTER — Encounter (HOSPITAL_COMMUNITY): Payer: Self-pay | Admitting: Obstetrics and Gynecology

## 2019-10-27 LAB — SURGICAL PATHOLOGY

## 2019-11-03 ENCOUNTER — Ambulatory Visit: Payer: Managed Care, Other (non HMO) | Admitting: Urology

## 2019-11-09 ENCOUNTER — Encounter: Payer: Managed Care, Other (non HMO) | Admitting: Obstetrics and Gynecology

## 2019-11-29 ENCOUNTER — Encounter: Payer: Self-pay | Admitting: Obstetrics and Gynecology

## 2019-11-29 ENCOUNTER — Ambulatory Visit (INDEPENDENT_AMBULATORY_CARE_PROVIDER_SITE_OTHER): Payer: Managed Care, Other (non HMO) | Admitting: Obstetrics and Gynecology

## 2019-11-29 VITALS — BP 117/79 | HR 71 | Ht 63.0 in | Wt 234.8 lb

## 2019-11-29 DIAGNOSIS — Z09 Encounter for follow-up examination after completed treatment for conditions other than malignant neoplasm: Secondary | ICD-10-CM

## 2019-11-29 NOTE — Progress Notes (Signed)
   Subjective:  Sheila Barrett is a 44 y.o. female now 4 weeks status post operative hysteroscopy.  And salpingectomy She feels fine has stopped having any discharge, has resumed sexual activity without concerns Review of Systems Negative except    Diet:   reg   Bowel movements : normal.  The patient is not having any pain.  Objective:  BP 117/79 (BP Location: Right Arm, Patient Position: Sitting, Cuff Size: Large)   Pulse 71   Ht 5\' 3"  (1.6 m)   Wt 234 lb 12.8 oz (106.5 kg)   BMI 41.59 kg/m  General:Well developed, well nourished.  No acute distress. Abdomen: Bowel sounds normal, soft, non-tender. Pelvic Exam:Regular deferred deferred     Incision(s):   {Healing well no drainage, no erythema, no hernia, no swelling, no dehiscence     Assessment:  Post-Op 4 weeks s/p operative hysteroscopy and Bilateral salpingectomy    Course postoperatively.   Plan:  1.Wound care discussed   2. . current medications.  None 3. Activity restrictions: none 4. return to work: not applicable. 5. Follow up in 5 years 4 Pap smears, as needed medication refills.

## 2020-03-09 DIAGNOSIS — F329 Major depressive disorder, single episode, unspecified: Secondary | ICD-10-CM | POA: Diagnosis not present

## 2020-03-16 ENCOUNTER — Ambulatory Visit: Payer: Managed Care, Other (non HMO) | Admitting: Adult Health

## 2020-06-12 ENCOUNTER — Other Ambulatory Visit (HOSPITAL_COMMUNITY): Payer: Self-pay | Admitting: Internal Medicine

## 2020-06-12 DIAGNOSIS — Z1231 Encounter for screening mammogram for malignant neoplasm of breast: Secondary | ICD-10-CM

## 2020-07-19 ENCOUNTER — Ambulatory Visit (HOSPITAL_COMMUNITY)
Admission: RE | Admit: 2020-07-19 | Discharge: 2020-07-19 | Disposition: A | Discharge status: Home / Self Care | Payer: PRIVATE HEALTH INSURANCE | Source: Ambulatory Visit | Attending: Internal Medicine | Admitting: Internal Medicine

## 2020-07-19 ENCOUNTER — Other Ambulatory Visit: Payer: Self-pay

## 2020-07-19 DIAGNOSIS — Z1231 Encounter for screening mammogram for malignant neoplasm of breast: Secondary | ICD-10-CM | POA: Diagnosis not present

## 2021-03-13 ENCOUNTER — Ambulatory Visit: Payer: Self-pay | Admitting: Orthopedic Surgery

## 2021-03-13 DIAGNOSIS — Z01818 Encounter for other preprocedural examination: Secondary | ICD-10-CM

## 2021-03-21 NOTE — Progress Notes (Signed)
Surgical Instructions    Your procedure is scheduled on Wednesday December 28th.  Report to [facility] Main Entrance "A" at 2 P.M., then check in with the Admitting office.  Call this number if you have problems the morning of surgery:  [phone]   If you have any questions prior to your surgery date call [phone]: Open Monday-Friday 8am-4pm    Remember:  Do not eat after midnight the night before your surgery  You may drink clear liquids until 1pm the afternoon of your surgery.   Clear liquids allowed are: Water, Non-Citrus Juices (without pulp), Carbonated Beverages, Clear Tea, Black Coffee ONLY (NO MILK, CREAM OR POWDERED CREAMER of any kind), and Gatorade    Take these medicines the morning of surgery with A SIP OF WATER citalopram (CELEXA) 20 MG tablet gabapentin (NEURONTIN) 300 MG capsule omeprazole (PRILOSEC OTC) 20 MG tablet  IF NEEDED acetaminophen (TYLENOL) 325 MG tablet traMADol (ULTRAM) 50 MG tablet  As of today, STOP taking any Aspirin (unless otherwise instructed by your surgeon) Aleve, Naproxen, Ibuprofen, Motrin, Advil, Goody's, BC's, all herbal medications, fish oil, and all vitamins.     After your COVID test   You are not required to quarantine however you are required to wear a well-fitting mask when you are out and around people not in your household.  If your mask becomes wet or soiled, replace with a new one.  Wash your hands often with soap and water for 20 seconds or clean your hands with an alcohol-based hand sanitizer that contains at least 60% alcohol.  Do not share personal items.  Notify your provider: if you are in close contact with someone who has COVID  or if you develop a fever of 100.4 or greater, sneezing, cough, sore throat, shortness of breath or body aches.             Do not wear jewelry or makeup Do not wear lotions, powders, perfumes, or deodorant. Do not shave 48 hours prior to surgery.   Do not bring valuables to the  hospital. DO Not wear nail polish, gel polish, artificial nails, or any other type of covering on natural nails including finger and toenails. If patients have artificial nails, gel coating, etc. that need to be removed by a nail salon, please have this removed prior to surgery or surgery may need to be canceled/delayed if the surgeon/ anesthesia feels like the patient is unable to be adequately monitored.             [facility] is not responsible for any belongings or valuables.  Do NOT Smoke (Tobacco/Vaping)  24 hours prior to your procedure  If you use a CPAP at night, you may bring your mask for your overnight stay.   Contacts, glasses, hearing aids, dentures or partials may not be worn into surgery, please bring cases for these belongings   For patients admitted to the hospital, discharge time will be determined by your treatment team.   Patients discharged the day of surgery will not be allowed to drive home, and someone needs to stay with them for 24 hours.  NO VISITORS WILL BE ALLOWED IN PRE-OP WHERE PATIENTS ARE PREPPED FOR SURGERY.  ONLY 1 SUPPORT PERSON MAY BE PRESENT IN THE WAITING ROOM WHILE YOU ARE IN SURGERY.  IF YOU ARE TO BE ADMITTED, ONCE YOU ARE IN YOUR ROOM YOU WILL BE ALLOWED TWO (2) VISITORS. 1 (ONE) VISITOR MAY STAY OVERNIGHT BUT MUST ARRIVE TO THE ROOM BY 8pm.  Minor children may have two parents present. Special consideration for safety and communication needs will be reviewed on a case by case basis.  Special instructions:    Oral Hygiene is also important to reduce your risk of infection.  Remember - BRUSH YOUR TEETH THE MORNING OF SURGERY WITH YOUR REGULAR TOOTHPASTE   [facility]- Preparing For Surgery  Before surgery, you can play an important role. Because skin is not sterile, your skin needs to be as free of germs as possible. You can reduce the number of germs on your skin by washing with CHG (chlorahexidine gluconate) Soap before surgery.  CHG is an  antiseptic cleaner which kills germs and bonds with the skin to continue killing germs even after washing.     Please do not use if you have an allergy to CHG or antibacterial soaps. If your skin becomes reddened/irritated stop using the CHG.  Do not shave (including legs and underarms) for at least 48 hours prior to first CHG shower. It is OK to shave your face.  Please follow these instructions carefully.     Shower the NIGHT BEFORE SURGERY and the MORNING OF SURGERY with CHG Soap.   If you chose to wash your hair, wash your hair first as usual with your normal shampoo. After you shampoo, rinse your hair and body thoroughly to remove the shampoo.  Then [employer] and genitals (private parts) with your normal soap and rinse thoroughly to remove soap.  After that Use CHG Soap as you would any other liquid soap. You can apply CHG directly to the skin and wash gently with a scrungie or a clean washcloth.   Apply the CHG Soap to your body ONLY FROM THE NECK DOWN.  Do not use on open wounds or open sores. Avoid contact with your eyes, ears, mouth and genitals (private parts). Wash Face and genitals (private parts)  with your normal soap.   Wash thoroughly, paying special attention to the area where your surgery will be performed.  Thoroughly rinse your body with warm water from the neck down.  DO NOT shower/wash with your normal soap after using and rinsing off the CHG Soap.  Pat yourself dry with a CLEAN TOWEL.  Wear CLEAN PAJAMAS to bed the night before surgery  Place CLEAN SHEETS on your bed the night before your surgery  DO NOT SLEEP WITH PETS.   Day of Surgery:  Take a shower with CHG soap. Wear Clean/Comfortable clothing the morning of surgery Do not apply any deodorants/lotions.   Remember to brush your teeth WITH YOUR REGULAR TOOTHPASTE.   Please read over the following fact sheets that you were given.

## 2021-03-22 ENCOUNTER — Encounter (HOSPITAL_COMMUNITY): Payer: Self-pay

## 2021-03-22 ENCOUNTER — Other Ambulatory Visit: Payer: Self-pay

## 2021-03-22 ENCOUNTER — Encounter (HOSPITAL_COMMUNITY)
Admission: RE | Admit: 2021-03-22 | Discharge: 2021-03-22 | Disposition: A | Discharge status: Home / Self Care | Payer: 59 | Source: Ambulatory Visit | Attending: Orthopedic Surgery | Admitting: Orthopedic Surgery

## 2021-03-22 DIAGNOSIS — K219 Gastro-esophageal reflux disease without esophagitis: Secondary | ICD-10-CM | POA: Insufficient documentation

## 2021-03-22 DIAGNOSIS — F419 Anxiety disorder, unspecified: Secondary | ICD-10-CM | POA: Insufficient documentation

## 2021-03-22 DIAGNOSIS — Z6841 Body Mass Index (BMI) 40.0 and over, adult: Secondary | ICD-10-CM | POA: Insufficient documentation

## 2021-03-22 DIAGNOSIS — Z01812 Encounter for preprocedural laboratory examination: Secondary | ICD-10-CM | POA: Insufficient documentation

## 2021-03-22 DIAGNOSIS — M5116 Intervertebral disc disorders with radiculopathy, lumbar region: Secondary | ICD-10-CM | POA: Diagnosis not present

## 2021-03-22 DIAGNOSIS — Z01818 Encounter for other preprocedural examination: Secondary | ICD-10-CM

## 2021-03-22 HISTORY — DX: Family history of other specified conditions: Z84.89

## 2021-03-22 HISTORY — DX: Anxiety disorder, unspecified: F41.9

## 2021-03-22 LAB — URINALYSIS, ROUTINE W REFLEX MICROSCOPIC
Bilirubin Urine: NEGATIVE
Glucose, UA: NEGATIVE mg/dL
Ketones, ur: 5 mg/dL — AB
Leukocytes,Ua: NEGATIVE
Nitrite: NEGATIVE
Protein, ur: NEGATIVE mg/dL
Specific Gravity, Urine: 1.027 (ref 1.005–1.030)
pH: 5 (ref 5.0–8.0)

## 2021-03-22 LAB — BASIC METABOLIC PANEL
Anion gap: 8 (ref 5–15)
BUN: 11 mg/dL (ref 6–20)
CO2: 28 mmol/L (ref 22–32)
Calcium: 9.2 mg/dL (ref 8.9–10.3)
Chloride: 101 mmol/L (ref 98–111)
Creatinine, Ser: 0.78 mg/dL (ref 0.44–1.00)
GFR, Estimated: >60 mL/min (ref >60)
Glucose, Bld: 86 mg/dL (ref 70–99)
Potassium: 3.7 mmol/L (ref 3.5–5.1)
Sodium: 137 mmol/L (ref 135–145)

## 2021-03-22 LAB — PROTIME-INR
INR: 0.9 (ref 0.8–1.2)
Prothrombin Time: 12.2 seconds (ref 11.4–15.2)

## 2021-03-22 LAB — CBC
HCT: 42.9 % (ref 36.0–46.0)
Hemoglobin: 14 g/dL (ref 12.0–15.0)
MCH: 28.3 pg (ref 26.0–34.0)
MCHC: 32.6 g/dL (ref 30.0–36.0)
MCV: 86.7 fL (ref 80.0–100.0)
Platelets: 268 10*3/uL (ref 150–400)
RBC: 4.95 MIL/uL (ref 3.87–5.11)
RDW: 13.3 % (ref 11.5–15.5)
WBC: 10.8 10*3/uL — ABNORMAL HIGH (ref 4.0–10.5)
nRBC: 0 % (ref 0.0–0.2)

## 2021-03-22 LAB — APTT: aPTT: 27 seconds (ref 24–36)

## 2021-03-22 LAB — SURGICAL PCR SCREEN
MRSA, PCR: NEGATIVE
Staphylococcus aureus: NEGATIVE

## 2021-03-22 NOTE — Anesthesia Preprocedure Evaluation (Addendum)
Anesthesia Evaluation  Patient identified by MRN, date of birth, ID band Patient awake    Reviewed: Allergy & Precautions, NPO status , Patient's Chart, lab work & pertinent test results  Airway Mallampati: II  TM Distance: >3 FB Neck ROM: Full    Dental no notable dental hx. (+) Teeth Intact, Dental Advisory Given   Pulmonary neg pulmonary ROS,    Pulmonary exam normal breath sounds clear to auscultation       Cardiovascular negative cardio ROS Normal cardiovascular exam Rhythm:Regular Rate:Normal     Neuro/Psych negative neurological ROS  negative psych ROS   GI/Hepatic Neg liver ROS, GERD  ,  Endo/Other  Morbid obesity  Renal/GU negative Renal ROS  negative genitourinary   Musculoskeletal negative musculoskeletal ROS (+)   Abdominal   Peds negative pediatric ROS (+)  Hematology negative hematology ROS (+)   Anesthesia Other Findings   Reproductive/Obstetrics negative OB ROS                            Anesthesia Physical Anesthesia Plan  ASA: 2  Anesthesia Plan: General   Post-op Pain Management:    Induction: Intravenous  PONV Risk Score and Plan: 3 and Ondansetron, Dexamethasone, Midazolam and Treatment may vary due to age or medical condition  Airway Management Planned: Oral ETT  Additional Equipment:   Intra-op Plan:   Post-operative Plan: Extubation in OR  Informed Consent: I have reviewed the patients History and Physical, chart, labs and discussed the procedure including the risks, benefits and alternatives for the proposed anesthesia with the patient or authorized representative who has indicated his/her understanding and acceptance.     Dental advisory given  Plan Discussed with: CRNA and Surgeon  Anesthesia Plan Comments: (PAT note written 03/22/2021 by Shonna Chock, PA-C. )       Anesthesia Quick Evaluation

## 2021-03-22 NOTE — Progress Notes (Addendum)
PCP - [facility] Dr. Lenise Herald  Chest x-ray - Not indicated EKG - Not indicated  COVID TEST- 03/27/21   Anesthesia review:Yes per MD request  Patient denies shortness of breath, fever, cough and chest pain at PAT appointment   All instructions explained to the patient, with a verbal understanding of the material. Patient agrees to go over the instructions while at home for a better understanding. Patient also instructed to wear a mask while in public after being tested for COVID-19. The opportunity to ask questions was provided.

## 2021-03-22 NOTE — Progress Notes (Signed)
Anesthesia Chart Review:  Case: [id number] Date/Time: 03/28/21 1545   Procedure: Left L4-5 discectomy (Left) - 3 hrs 3 C-Bed   Anesthesia type: General   Pre-op diagnosis: Herniated disc and lumbar radiculopathy   Location: [facility] ROOM 04 / [facility]   Surgeons: Venita Lick, MD       DISCUSSION: Patient is a 45 year old female scheduled for the above procedure.   History includes never smoker, GERD, anxiety, nephrolithiasis, bilateral salpingectomy (10/26/19). Reported sister took longer to wake up after anesthesia. BMI is consistent with morbid obesity.   She denied chest pain, SOB at PAT RN visit. Preoperative COVID-19 testing is scheduled for 03/27/21. Anesthesia team to evaluate on the day of surgery.    VS: BP (!) 142/92    Pulse 88    Temp 36.4 C (Oral)    Resp 18    Ht 5\' 3"  (1.6 m)    Wt 110.5 kg    SpO2 98%    BMI 43.13 kg/m   PROVIDERS: , PA is PCP (Pllc, Lenise Herald)         LABS: Labs reviewed: Acceptable for surgery. (all labs ordered are listed, but only abnormal results are displayed)  Labs Reviewed  CBC - Abnormal; Notable for the following components:      Result Value   WBC 10.8 (*)    All other components within normal limits  URINALYSIS, ROUTINE W REFLEX MICROSCOPIC - Abnormal; Notable for the following components:   APPearance HAZY (*)    Hgb urine dipstick SMALL (*)    Ketones, ur 5 (*)    Bacteria, UA FEW (*)    All other components within normal limits  SURGICAL PCR SCREEN  BASIC METABOLIC PANEL  PROTIME-INR  APTT    EKG: N/A. Last EKG seen 11/20/12 and showed SR with sinus arrhythmia.   CV: N/A  Past Medical History:  Diagnosis Date   Anxiety    Family history of adverse reaction to anesthesia    Sister took longer to wake   GERD (gastroesophageal reflux disease)    History of kidney stones    Kidney stones     Past Surgical History:  Procedure Laterality Date   CESAREAN SECTION     CESAREAN SECTION      HYSTEROSCOPY WITH D & C N/A 10/26/2019   Procedure: DILATATION AND CURETTAGE /HYSTEROSCOPY/ABLATION WITH MINERVA;  Surgeon: 10/28/2019, MD;  Location: [facility];  Service: Gynecology;  Laterality: N/A;   LAPAROSCOPIC BILATERAL SALPINGECTOMY Bilateral 10/26/2019   Procedure: LAPAROSCOPIC BILATERAL SALPINGECTOMY;  Surgeon: 10/28/2019, MD;  Location: [facility];  Service: Gynecology;  Laterality: Bilateral;   TUBAL LIGATION      MEDICATIONS:  acetaminophen (TYLENOL) 325 MG tablet   azithromycin (ZITHROMAX) 250 MG tablet   citalopram (CELEXA) 20 MG tablet   gabapentin (NEURONTIN) 300 MG capsule   Garlic 1000 MG CAPS   omeprazole (PRILOSEC OTC) 20 MG tablet   traMADol (ULTRAM) 50 MG tablet   No current facility-administered medications for this encounter.    Tilda Burrow, PA-C Surgical Short Stay/Anesthesiology [facility] Phone [phone] [facility] Phone [phone] 03/22/2021 1:19 PM

## 2021-03-27 ENCOUNTER — Other Ambulatory Visit (HOSPITAL_COMMUNITY)
Admission: RE | Admit: 2021-03-27 | Discharge: 2021-03-27 | Disposition: A | Discharge status: Home / Self Care | Payer: 59 | Source: Ambulatory Visit | Attending: Orthopedic Surgery | Admitting: Orthopedic Surgery

## 2021-03-27 DIAGNOSIS — Z20822 Contact with and (suspected) exposure to covid-19: Secondary | ICD-10-CM | POA: Diagnosis not present

## 2021-03-27 DIAGNOSIS — Z01812 Encounter for preprocedural laboratory examination: Secondary | ICD-10-CM | POA: Insufficient documentation

## 2021-03-27 DIAGNOSIS — Z01818 Encounter for other preprocedural examination: Secondary | ICD-10-CM

## 2021-03-27 LAB — SARS CORONAVIRUS 2 (TAT 6-24 HRS): SARS Coronavirus 2: NEGATIVE

## 2021-03-28 ENCOUNTER — Encounter (HOSPITAL_COMMUNITY): Payer: Self-pay | Admitting: Orthopedic Surgery

## 2021-03-28 ENCOUNTER — Ambulatory Visit (HOSPITAL_COMMUNITY): Payer: 59

## 2021-03-28 ENCOUNTER — Ambulatory Visit (HOSPITAL_COMMUNITY): Payer: 59 | Admitting: Vascular Surgery

## 2021-03-28 ENCOUNTER — Observation Stay (HOSPITAL_COMMUNITY)
Admission: RE | Admit: 2021-03-28 | Discharge: 2021-03-29 | Disposition: A | Discharge status: Home / Self Care | Payer: 59 | Attending: Orthopedic Surgery | Admitting: Orthopedic Surgery

## 2021-03-28 ENCOUNTER — Encounter (HOSPITAL_COMMUNITY)
Admission: RE | Disposition: A | Discharge status: Home / Self Care | Payer: Self-pay | Source: Home / Self Care | Attending: Orthopedic Surgery

## 2021-03-28 ENCOUNTER — Ambulatory Visit: Payer: Self-pay | Admitting: Orthopedic Surgery

## 2021-03-28 ENCOUNTER — Other Ambulatory Visit: Payer: Self-pay

## 2021-03-28 DIAGNOSIS — Z01818 Encounter for other preprocedural examination: Secondary | ICD-10-CM

## 2021-03-28 DIAGNOSIS — M5116 Intervertebral disc disorders with radiculopathy, lumbar region: Principal | ICD-10-CM | POA: Insufficient documentation

## 2021-03-28 DIAGNOSIS — M5126 Other intervertebral disc displacement, lumbar region: Secondary | ICD-10-CM | POA: Diagnosis present

## 2021-03-28 DIAGNOSIS — Z419 Encounter for procedure for purposes other than remedying health state, unspecified: Secondary | ICD-10-CM

## 2021-03-28 HISTORY — PX: LUMBAR LAMINECTOMY/DECOMPRESSION MICRODISCECTOMY: SHX5026

## 2021-03-28 LAB — POCT PREGNANCY, URINE: Preg Test, Ur: NEGATIVE

## 2021-03-28 SURGERY — LUMBAR LAMINECTOMY/DECOMPRESSION MICRODISCECTOMY 1 LEVEL
Anesthesia: General | Site: Spine Lumbar | Laterality: Left

## 2021-03-28 MED ORDER — PHENOL 1.4 % MT LIQD: 1.0000 | OROMUCOSAL | Status: DC | PRN

## 2021-03-28 MED ORDER — DEXMEDETOMIDINE (PRECEDEX) IN NS 20 MCG/5ML (4 MCG/ML) IV SYRINGE
PREFILLED_SYRINGE | INTRAVENOUS | Status: AC
  Filled 2021-03-28: qty 5

## 2021-03-28 MED ORDER — ONDANSETRON HCL 4 MG/2ML IJ SOLN
INTRAMUSCULAR | Status: DC | PRN
  Administered 2021-03-28: 4 mg via INTRAVENOUS

## 2021-03-28 MED ORDER — FENTANYL CITRATE (PF) 250 MCG/5ML IJ SOLN
INTRAMUSCULAR | Status: DC | PRN
  Administered 2021-03-28: 100 ug via INTRAVENOUS
  Administered 2021-03-28: 50 ug via INTRAVENOUS
  Administered 2021-03-28: 100 ug via INTRAVENOUS

## 2021-03-28 MED ORDER — LIDOCAINE 2% (20 MG/ML) 5 ML SYRINGE
INTRAMUSCULAR | Status: DC | PRN
  Administered 2021-03-28: 100 mg via INTRAVENOUS

## 2021-03-28 MED ORDER — OMEPRAZOLE MAGNESIUM 20 MG PO TBEC: 20.0000 mg | DELAYED_RELEASE_TABLET | Freq: Every day | ORAL | Status: DC

## 2021-03-28 MED ORDER — OXYCODONE-ACETAMINOPHEN 10-325 MG PO TABS: 1.0000 | ORAL_TABLET | Freq: Four times a day (QID) | ORAL | 0 refills | Status: AC | PRN

## 2021-03-28 MED ORDER — POLYETHYLENE GLYCOL 3350 17 G PO PACK: 17.0000 g | PACK | Freq: Every day | ORAL | Status: DC | PRN

## 2021-03-28 MED ORDER — ONDANSETRON HCL 4 MG PO TABS: 4.0000 mg | ORAL_TABLET | Freq: Four times a day (QID) | ORAL | Status: DC | PRN

## 2021-03-28 MED ORDER — 0.9 % SODIUM CHLORIDE (POUR BTL) OPTIME
TOPICAL | Status: DC | PRN
  Administered 2021-03-28: 14:00:00 1000 mL

## 2021-03-28 MED ORDER — OXYCODONE HCL 5 MG PO TABS
5.0000 mg | ORAL_TABLET | ORAL | Status: DC | PRN
  Administered 2021-03-29 (×3): 5 mg via ORAL
  Filled 2021-03-28 (×3): qty 1

## 2021-03-28 MED ORDER — VANCOMYCIN HCL 1500 MG/300ML IV SOLN
1500.0000 mg | Freq: Once | INTRAVENOUS | Status: AC
  Administered 2021-03-29: 1500 mg via INTRAVENOUS
  Filled 2021-03-28: qty 300

## 2021-03-28 MED ORDER — PROPOFOL 10 MG/ML IV BOLUS
INTRAVENOUS | Status: AC
  Filled 2021-03-28: qty 20

## 2021-03-28 MED ORDER — MIDAZOLAM HCL 5 MG/5ML IJ SOLN
INTRAMUSCULAR | Status: DC | PRN
  Administered 2021-03-28: 2 mg via INTRAVENOUS

## 2021-03-28 MED ORDER — DOCUSATE SODIUM 100 MG PO CAPS
100.0000 mg | ORAL_CAPSULE | Freq: Two times a day (BID) | ORAL | Status: DC
  Administered 2021-03-28 – 2021-03-29 (×2): 100 mg via ORAL
  Filled 2021-03-28 (×2): qty 1

## 2021-03-28 MED ORDER — OXYCODONE HCL 5 MG PO TABS: 5.0000 mg | ORAL_TABLET | Freq: Once | ORAL | Status: DC | PRN

## 2021-03-28 MED ORDER — PROMETHAZINE HCL 25 MG/ML IJ SOLN: 6.2500 mg | INTRAMUSCULAR | Status: DC | PRN

## 2021-03-28 MED ORDER — ROCURONIUM BROMIDE 100 MG/10ML IV SOLN
INTRAVENOUS | Status: DC | PRN
  Administered 2021-03-28: 80 mg via INTRAVENOUS

## 2021-03-28 MED ORDER — GABAPENTIN 300 MG PO CAPS
300.0000 mg | ORAL_CAPSULE | Freq: Two times a day (BID) | ORAL | Status: DC
  Administered 2021-03-29 (×2): 300 mg via ORAL
  Filled 2021-03-28: qty 1

## 2021-03-28 MED ORDER — HYDROMORPHONE HCL 1 MG/ML IJ SOLN
INTRAMUSCULAR | Status: AC
  Filled 2021-03-28: qty 1

## 2021-03-28 MED ORDER — SODIUM CHLORIDE 0.9 % IV SOLN: 250.0000 mL | INTRAVENOUS | Status: DC

## 2021-03-28 MED ORDER — OXYCODONE HCL 5 MG PO TABS
10.0000 mg | ORAL_TABLET | ORAL | Status: DC | PRN
  Administered 2021-03-28 (×2): 10 mg via ORAL
  Filled 2021-03-28 (×2): qty 2

## 2021-03-28 MED ORDER — MIDAZOLAM HCL 2 MG/2ML IJ SOLN
INTRAMUSCULAR | Status: AC
  Filled 2021-03-28: qty 2

## 2021-03-28 MED ORDER — SUGAMMADEX SODIUM 200 MG/2ML IV SOLN
INTRAVENOUS | Status: DC | PRN
  Administered 2021-03-28: 200 mg via INTRAVENOUS
  Administered 2021-03-28: 25 mg via INTRAVENOUS

## 2021-03-28 MED ORDER — BUPIVACAINE-EPINEPHRINE (PF) 0.25% -1:200000 IJ SOLN
INTRAMUSCULAR | Status: AC
  Filled 2021-03-28: qty 30

## 2021-03-28 MED ORDER — ORAL CARE MOUTH RINSE: 15.0000 mL | Freq: Once | OROMUCOSAL | Status: AC

## 2021-03-28 MED ORDER — BUPIVACAINE-EPINEPHRINE 0.25% -1:200000 IJ SOLN
INTRAMUSCULAR | Status: DC | PRN
  Administered 2021-03-28: 10 mL

## 2021-03-28 MED ORDER — SODIUM CHLORIDE 0.9% FLUSH: 3.0000 mL | Freq: Two times a day (BID) | INTRAVENOUS | Status: DC

## 2021-03-28 MED ORDER — DEXMEDETOMIDINE (PRECEDEX) IN NS 20 MCG/5ML (4 MCG/ML) IV SYRINGE
PREFILLED_SYRINGE | INTRAVENOUS | Status: DC | PRN
  Administered 2021-03-28: 20 ug via INTRAVENOUS

## 2021-03-28 MED ORDER — METHOCARBAMOL 500 MG PO TABS
500.0000 mg | ORAL_TABLET | Freq: Four times a day (QID) | ORAL | Status: DC | PRN
  Administered 2021-03-28 – 2021-03-29 (×3): 500 mg via ORAL
  Filled 2021-03-28 (×3): qty 1

## 2021-03-28 MED ORDER — LIDOCAINE 2% (20 MG/ML) 5 ML SYRINGE
INTRAMUSCULAR | Status: AC
  Filled 2021-03-28: qty 5

## 2021-03-28 MED ORDER — PHENYLEPHRINE 40 MCG/ML (10ML) SYRINGE FOR IV PUSH (FOR BLOOD PRESSURE SUPPORT)
PREFILLED_SYRINGE | INTRAVENOUS | Status: DC | PRN
  Administered 2021-03-28: 80 ug via INTRAVENOUS
  Administered 2021-03-28: 120 ug via INTRAVENOUS

## 2021-03-28 MED ORDER — THROMBIN 20000 UNITS EX SOLR: CUTANEOUS | Status: DC | PRN

## 2021-03-28 MED ORDER — PROPOFOL 10 MG/ML IV BOLUS
INTRAVENOUS | Status: DC | PRN
  Administered 2021-03-28: 20 mg via INTRAVENOUS
  Administered 2021-03-28 (×2): 30 mg via INTRAVENOUS
  Administered 2021-03-28: 20 mg via INTRAVENOUS
  Administered 2021-03-28: 30 mg via INTRAVENOUS
  Administered 2021-03-28: 200 mg via INTRAVENOUS

## 2021-03-28 MED ORDER — ACETAMINOPHEN 650 MG RE SUPP: 650.0000 mg | RECTAL | Status: DC | PRN

## 2021-03-28 MED ORDER — ONDANSETRON HCL 4 MG PO TABS: 4.0000 mg | ORAL_TABLET | Freq: Three times a day (TID) | ORAL | 0 refills | Status: AC | PRN

## 2021-03-28 MED ORDER — HYDROMORPHONE HCL 1 MG/ML IJ SOLN
0.2500 mg | INTRAMUSCULAR | Status: DC | PRN
Start: 2021-03-28 — End: 2021-03-28
  Administered 2021-03-28 (×2): 0.25 mg via INTRAVENOUS

## 2021-03-28 MED ORDER — METHOCARBAMOL 1000 MG/10ML IJ SOLN
500.0000 mg | Freq: Four times a day (QID) | INTRAVENOUS | Status: DC | PRN
  Filled 2021-03-28: qty 5

## 2021-03-28 MED ORDER — LACTATED RINGERS IV SOLN: INTRAVENOUS | Status: DC

## 2021-03-28 MED ORDER — DEXAMETHASONE SODIUM PHOSPHATE 10 MG/ML IJ SOLN
INTRAMUSCULAR | Status: DC | PRN
  Administered 2021-03-28: 10 mg via INTRAVENOUS

## 2021-03-28 MED ORDER — FENTANYL CITRATE (PF) 250 MCG/5ML IJ SOLN
INTRAMUSCULAR | Status: AC
  Filled 2021-03-28: qty 5

## 2021-03-28 MED ORDER — VANCOMYCIN HCL 1500 MG/300ML IV SOLN
1500.0000 mg | INTRAVENOUS | Status: AC
  Administered 2021-03-28: 12:00:00 1500 mg via INTRAVENOUS
  Filled 2021-03-28: qty 300

## 2021-03-28 MED ORDER — CITALOPRAM HYDROBROMIDE 20 MG PO TABS
20.0000 mg | ORAL_TABLET | Freq: Every day | ORAL | Status: DC
  Administered 2021-03-29: 09:00:00 20 mg via ORAL
  Filled 2021-03-28: qty 1

## 2021-03-28 MED ORDER — ACETAMINOPHEN 10 MG/ML IV SOLN: 1000.0000 mg | Freq: Once | INTRAVENOUS | Status: DC | PRN

## 2021-03-28 MED ORDER — ONDANSETRON HCL 4 MG/2ML IJ SOLN: 4.0000 mg | Freq: Four times a day (QID) | INTRAMUSCULAR | Status: DC | PRN

## 2021-03-28 MED ORDER — ACETAMINOPHEN 10 MG/ML IV SOLN
INTRAVENOUS | Status: DC | PRN
  Administered 2021-03-28: 1000 mg via INTRAVENOUS

## 2021-03-28 MED ORDER — HYDROMORPHONE HCL 1 MG/ML IJ SOLN: 0.5000 mg | INTRAMUSCULAR | Status: AC | PRN

## 2021-03-28 MED ORDER — OXYCODONE HCL 5 MG/5ML PO SOLN: 5.0000 mg | Freq: Once | ORAL | Status: DC | PRN

## 2021-03-28 MED ORDER — MENTHOL 3 MG MT LOZG
1.0000 | LOZENGE | OROMUCOSAL | Status: DC | PRN
  Filled 2021-03-28: qty 9

## 2021-03-28 MED ORDER — METHOCARBAMOL 500 MG PO TABS: 500.0000 mg | ORAL_TABLET | Freq: Three times a day (TID) | ORAL | 0 refills | Status: AC | PRN

## 2021-03-28 MED ORDER — CHLORHEXIDINE GLUCONATE 0.12 % MT SOLN: 15.0000 mL | Freq: Once | OROMUCOSAL | Status: AC

## 2021-03-28 MED ORDER — SODIUM CHLORIDE 0.9% FLUSH: 3.0000 mL | INTRAVENOUS | Status: DC | PRN

## 2021-03-28 MED ORDER — PANTOPRAZOLE SODIUM 40 MG PO TBEC
40.0000 mg | DELAYED_RELEASE_TABLET | Freq: Every day | ORAL | Status: DC
  Administered 2021-03-29: 09:00:00 40 mg via ORAL
  Filled 2021-03-28: qty 1

## 2021-03-28 MED ORDER — GABAPENTIN 300 MG PO CAPS
600.0000 mg | ORAL_CAPSULE | Freq: Every day | ORAL | Status: DC
  Administered 2021-03-28: 22:00:00 600 mg via ORAL
  Filled 2021-03-28 (×2): qty 2

## 2021-03-28 MED ORDER — ACETAMINOPHEN 10 MG/ML IV SOLN
INTRAVENOUS | Status: AC
  Filled 2021-03-28: qty 100

## 2021-03-28 MED ORDER — ACETAMINOPHEN 325 MG PO TABS
650.0000 mg | ORAL_TABLET | ORAL | Status: DC | PRN
  Administered 2021-03-29: 11:00:00 650 mg via ORAL
  Filled 2021-03-28: qty 2

## 2021-03-28 MED ORDER — CHLORHEXIDINE GLUCONATE 0.12 % MT SOLN
OROMUCOSAL | Status: AC
  Administered 2021-03-28: 12:00:00 15 mL via OROMUCOSAL
  Filled 2021-03-28: qty 15

## 2021-03-28 SURGICAL SUPPLY — 56 items
BAG COUNTER SPONGE SURGICOUNT (BAG) ×2 IMPLANT
BNDG GAUZE ELAST 4 BULKY (GAUZE/BANDAGES/DRESSINGS) ×2 IMPLANT
BUR EGG ELITE 4.0 (BURR) IMPLANT
BUR MATCHSTICK NEURO 3.0 LAGG (BURR) IMPLANT
CANISTER SUCT 3000ML PPV (MISCELLANEOUS) ×2 IMPLANT
CLSR STERI-STRIP ANTIMIC 1/2X4 (GAUZE/BANDAGES/DRESSINGS) ×2 IMPLANT
CORD BIPOLAR FORCEPS 12FT (ELECTRODE) ×2 IMPLANT
COVER SURGICAL LIGHT HANDLE (MISCELLANEOUS) ×2 IMPLANT
DRAIN CHANNEL 15F RND FF W/TCR (WOUND CARE) IMPLANT
DRAPE POUCH INSTRU U-SHP 10X18 (DRAPES) ×2 IMPLANT
DRAPE SURG 17X23 STRL (DRAPES) ×2 IMPLANT
DRAPE U-SHAPE 47X51 STRL (DRAPES) ×2 IMPLANT
DRSG OPSITE POSTOP 3X4 (GAUZE/BANDAGES/DRESSINGS) ×2 IMPLANT
DRSG OPSITE POSTOP 4X8 (GAUZE/BANDAGES/DRESSINGS) ×1 IMPLANT
DURAPREP 26ML APPLICATOR (WOUND CARE) ×2 IMPLANT
ELECT BLADE 4.0 EZ CLEAN MEGAD (MISCELLANEOUS)
ELECT CAUTERY BLADE 6.4 (BLADE) ×2 IMPLANT
ELECT PENCIL ROCKER SW 15FT (MISCELLANEOUS) ×2 IMPLANT
ELECT REM PT RETURN 9FT ADLT (ELECTROSURGICAL) ×2
ELECTRODE BLDE 4.0 EZ CLN MEGD (MISCELLANEOUS) IMPLANT
ELECTRODE REM PT RTRN 9FT ADLT (ELECTROSURGICAL) ×1 IMPLANT
EVACUATOR SILICONE 100CC (DRAIN) IMPLANT
GLOVE SURG ENC MOIS LTX SZ6.5 (GLOVE) ×2 IMPLANT
GLOVE SURG MICRO LTX SZ8.5 (GLOVE) ×2 IMPLANT
GLOVE SURG UNDER POLY LF SZ6.5 (GLOVE) ×2 IMPLANT
GLOVE SURG UNDER POLY LF SZ8.5 (GLOVE) ×2 IMPLANT
GOWN STRL REUS W/ TWL LRG LVL3 (GOWN DISPOSABLE) ×2 IMPLANT
GOWN STRL REUS W/TWL 2XL LVL3 (GOWN DISPOSABLE) ×2 IMPLANT
GOWN STRL REUS W/TWL LRG LVL3 (GOWN DISPOSABLE) ×2
KIT BASIN OR (CUSTOM PROCEDURE TRAY) ×2 IMPLANT
KIT TURNOVER KIT B (KITS) ×2 IMPLANT
NDL SPNL 18GX3.5 QUINCKE PK (NEEDLE) ×2 IMPLANT
NEEDLE 22X1 1/2 (OR ONLY) (NEEDLE) ×2 IMPLANT
NEEDLE SPNL 18GX3.5 QUINCKE PK (NEEDLE) ×4 IMPLANT
NS IRRIG 1000ML POUR BTL (IV SOLUTION) ×2 IMPLANT
PACK LAMINECTOMY ORTHO (CUSTOM PROCEDURE TRAY) ×2 IMPLANT
PACK UNIVERSAL I (CUSTOM PROCEDURE TRAY) ×2 IMPLANT
PAD ARMBOARD 7.5X6 YLW CONV (MISCELLANEOUS) ×4 IMPLANT
PATTIES SURGICAL .5 X.5 (GAUZE/BANDAGES/DRESSINGS) ×2 IMPLANT
PATTIES SURGICAL .5 X1 (DISPOSABLE) ×2 IMPLANT
SPONGE SURGIFOAM ABS GEL 100 (HEMOSTASIS) ×1 IMPLANT
STRIP CLOSURE SKIN 1/2X4 (GAUZE/BANDAGES/DRESSINGS) ×1 IMPLANT
SURGIFLO W/THROMBIN 8M KIT (HEMOSTASIS) ×1 IMPLANT
SUT BONE WAX W31G (SUTURE) ×2 IMPLANT
SUT MNCRL AB 3-0 PS2 27 (SUTURE) ×2 IMPLANT
SUT VIC AB 0 CT1 27 (SUTURE)
SUT VIC AB 0 CT1 27XBRD ANBCTR (SUTURE) IMPLANT
SUT VIC AB 1 CT1 18XCR BRD 8 (SUTURE) ×1 IMPLANT
SUT VIC AB 1 CT1 8-18 (SUTURE) ×1
SUT VIC AB 2-0 CT1 18 (SUTURE) ×2 IMPLANT
SYR BULB IRRIG 60ML STRL (SYRINGE) ×2 IMPLANT
SYR CONTROL 10ML LL (SYRINGE) ×2 IMPLANT
TOWEL GREEN STERILE (TOWEL DISPOSABLE) ×2 IMPLANT
TOWEL GREEN STERILE FF (TOWEL DISPOSABLE) ×2 IMPLANT
WATER STERILE IRR 1000ML POUR (IV SOLUTION) ×2 IMPLANT
YANKAUER SUCT BULB TIP NO VENT (SUCTIONS) IMPLANT

## 2021-03-28 NOTE — H&P (Signed)
Subjective:  Location: left Quality: aching; constant; worsening Severity: pain level 10/10; worst pain 10/10 Duration: years Timing: chronic Alleviating Factors: chiropractic care; OTC medication; NSAIDs; tens unit Aggravating Factors: sitting; standing; lying down; walking; bending/squatting; weightbearing; changing clothes; getting out of bed; going from sit to stand; morning Associated Symptoms: weakness (LLE); numbness (LLE); tingling (LLE); radiation down leg (LLE) Previous Surgery: none Prior Imaging: x ray; MRI (EO) Previous Injections: none Previous PT: none; none recently Notes: went to the ED Friday, given another dose pack. 2 days left. Taking Gabapentind BID, tramadol. MRI Results   Patient Active Problem List   Diagnosis Date Noted   Contraceptive management 10/13/2019   Past Medical History:  Diagnosis Date   Anxiety    Family history of adverse reaction to anesthesia    Sister took longer to wake   GERD (gastroesophageal reflux disease)    History of kidney stones    Kidney stones     Past Surgical History:  Procedure Laterality Date   CESAREAN SECTION     CESAREAN SECTION     HYSTEROSCOPY WITH D & C N/A 10/26/2019   Procedure: DILATATION AND CURETTAGE /HYSTEROSCOPY/ABLATION WITH MINERVA;  Surgeon: Tilda Burrow, MD;  Location: [facility];  Service: Gynecology;  Laterality: N/A;   LAPAROSCOPIC BILATERAL SALPINGECTOMY Bilateral 10/26/2019   Procedure: LAPAROSCOPIC BILATERAL SALPINGECTOMY;  Surgeon: Tilda Burrow, MD;  Location: [facility];  Service: Gynecology;  Laterality: Bilateral;   TUBAL LIGATION      No current facility-administered medications for this visit.   No current outpatient medications on file.   Facility-Administered Medications Ordered in Other Visits  Medication Dose Route Frequency Provider Last Rate Last Admin   lactated ringers infusion   Intravenous Continuous Eilene Ghazi, MD 10 mL/hr at 03/28/21 1156 New Bag at 03/28/21 1156    vancomycin (VANCOREADY) IVPB 1500 mg/300 mL  1,500 mg Intravenous 120 min pre-op Rhodia Albright, PA-C 150 mL/hr at 03/28/21 1157 1,500 mg at 03/28/21 1157   Allergies  Allergen Reactions   Keflex [Cephalexin] Hives   Cephalosporins     Social History   Tobacco Use   Smoking status: Never   Smokeless tobacco: Never  Substance Use Topics   Alcohol use: Yes    Comment: occasionally    Family History  Problem Relation Age of Onset   Cancer Mother        lung    Review of Systems Pertinent items are noted in HPI.  Objective:   General: AAOX3, well developed and well nourished, Laying on table, tearful, unable to find comfortable position Ambulation: antalgic gait pattern, uses no assistive device. Inspection: No obvious deformity Heart: Regular rate and rhythm, no rubs, murmurs, or gallops Lungs: Clear to auscultation bilaterally Abdomen: Bowel sounds 4, nondistended, nontender, no rebound tenderness. No loss of bladder or bowel control. Palpation: Tender over spinous processes and paraspinal muscles around the L4-5 level. AROM: - Knee: flexion and extension normal and pain free bilaterally. - Ankle: Dorsiflexion, plantarflexion, inversion, eversion normal and pain free. Dermatomes: Lower extremity sensation to light touch abnormal with positive pain and dysethesias in left L5 dermatome pattern. Myotomes: - 4/5 left anterior tib/EHL. Remaining LE 5/5 Bilaterally. Reflexes: - Clonus: Negative Special Tests: - Straight Leg Raise: Left Positive, Right Negative  PV: Extremities warm and well profused.  MRI Impression: MRI of lumbar spine dated 03/08/2021. Images as well as report were personally reviewed by me. At L4-5 there is a moderate to large central to the left disc  protrusion measuring 8 x 15 mm causing impingement on the descending left L5 nerve roots. At L1-2 there is a moderate to large right disc extrusion with possible impingement on the left L2 nerve roots. I do  not think this is the source of her pain but rather the large L4-5 disc herniation.  Assessment:   Sheila Barrett is a 45 year old female who presented initially on 11/22 with low back pain and intermittent radicular left leg pain in L5 dermatome pattern. Despite a Medrol Dosepak her pain worsened. She was also given gabapentin and tramadol for pain an MRI was ordered. Since her last visit her pain is progress he is now having constant, severe radicular leg pain in the left L5 dermatome pattern as well as a left foot drop. MRI demonstrates a large left-sided L4-5 disc protrusion causing left L5 nerve root impingement.   Plan:   We discussed treatment options including epidural steroid injection versus surgical intervention which would be a left L4-5 discectomy. Given the size of the disc herniation I think it is unlikely that the patient would get any significant relief with an injection. Additionally her symptoms are progressive and her leg pain is constant, severe, debilitating and she now has a foot drop. The patient has expressed a desire to move forward with surgical intervention which I do think is reasonable.  Risks and benefits of surgery were discussed with the patient. These include: Infection, bleeding, death, stroke, paralysis, ongoing or worse pain, need for additional surgery, leak of spinal fluid, adjacent segment degeneration requiring additional surgery, post-operative hematoma formation that can result in neurological compromise and the need for urgent/emergent re-operation. Loss in bowel and bladder control. Injury to major vessels that could result in the need for urgent abdominal surgery to stop bleeding. Risk of deep venous thrombosis (DVT) and the need for additional treatment. Recurrent disc herniation resulting in the need for revision surgery, which could include fusion surgery (utilizing instrumentation such as pedicle screws and intervertebral cages).  Additional risk: If  instrumentation is used there is a risk of migration, or breakage of that hardware that could require additional surgery. We also discussed that the patient's BMI which is greater than 40 further increases her risk for wound healing complications, infection, recurrent disc herniation and need for additional surgery.  We will move forward with medical clearance from her primary care provider. She was given a form today.  We also gave her an LSO brace today.  I also reviewed discharge instructions with her today. She was given a copy.  We have also discussed the post-operative recovery period to include: bathing/showering restrictions, wound healing, activity (and driving) restrictions, medications/pain mangement.  We have also discussed post-operative redflags to include: signs and symptoms of postoperative infection, DVT/PE.  All questions were invited and answered.  Recommend she increase her gabapentin dose to 4 times per day. She will contact me when/if she needs a refill.  We will plan to move forward with surgical intervention on a sooner rather than later basis. She will follow up 2 weeks after surgery for her preop H&P   Patient was seen with the PA. Agree with the assessment. Patient has significant radiculopathy with neurological deficits and radicular leg pain. Imaging studies confirmed a large L4-5 posterior lateral to the left disc herniation causing marked compression of the L5 nerve root. I have gone over the surgical procedure which is an L4-5 discectomy and we have discussed the risks, benefits, and alternatives. Patient and her mother were present  for this conversation and all their questions were encouraged and addressed. Plan on moving forward with surgery on 03/28/2021.

## 2021-03-28 NOTE — Brief Op Note (Signed)
03/28/2021  3:22 PM  PATIENT:  Sheila Barrett  45 y.o. female  PRE-OPERATIVE DIAGNOSIS:  Herniated disc and lumbar radiculopathy  POST-OPERATIVE DIAGNOSIS:  Herniated disc and lumbar radiculopathy  PROCEDURE:  Procedure(s) with comments: LEFT LUMBAR FOUR THROUGH LUMBAR FIVE DISCECTOMY (Left) - 3 hrs 3 C-Bed  SURGEON:  Surgeon(s) and Role:    Venita Lick, MD - Primary  PHYSICIAN ASSISTANT:   ASSISTANTS: Voncille Lo, PA   ANESTHESIA:   general  EBL:  15 cc   BLOOD ADMINISTERED:none  DRAINS: none   LOCAL MEDICATIONS USED:  MARCAINE     SPECIMEN:  No Specimen  DISPOSITION OF SPECIMEN:  PATHOLOGY  COUNTS:  YES  TOURNIQUET:  * No tourniquets in log *  DICTATION: .Dragon Dictation  PLAN OF CARE: Admit for overnight observation  PATIENT DISPOSITION:  PACU - hemodynamically stable.

## 2021-03-28 NOTE — OR Nursing (Signed)
Dr. Signa Kell read and interpreted x-ray showing surgical probe posterior to L4-5.

## 2021-03-28 NOTE — Anesthesia Postprocedure Evaluation (Signed)
Anesthesia Post Note  Patient: Sheila Barrett  Procedure(Barrett) Performed: LEFT LUMBAR FOUR THROUGH LUMBAR FIVE DISCECTOMY (Left: Spine Lumbar)     Patient location during evaluation: PACU Anesthesia Type: General Level of consciousness: awake and alert Pain management: pain level controlled Vital Signs Assessment: post-procedure vital signs reviewed and stable Respiratory status: spontaneous breathing, nonlabored ventilation, respiratory function stable and patient connected to nasal cannula oxygen Cardiovascular status: blood pressure returned to baseline and stable Postop Assessment: no apparent nausea or vomiting Anesthetic complications: no   No notable events documented.  Last Vitals:  Vitals:   03/28/21 1640 03/28/21 1720  BP: 126/77 127/77  Pulse: 90 86  Resp: 18 18  Temp: 36.6 C 36.8 C  SpO2: 97% 96%    Last Pain:  Vitals:   03/28/21 1720  TempSrc: Oral  PainSc:                  Sheila Barrett

## 2021-03-28 NOTE — H&P (Deleted)
  The note originally documented on this encounter has been moved the the encounter in which it belongs.  

## 2021-03-28 NOTE — Anesthesia Procedure Notes (Signed)
Procedure Name: Intubation Date/Time: 03/28/2021 1:27 PM Performed by: Jenne Campus, CRNA Pre-anesthesia Checklist: Patient identified, Emergency Drugs available, Suction available and Patient being monitored Patient Re-evaluated:Patient Re-evaluated prior to induction Oxygen Delivery Method: Circle system utilized Preoxygenation: Pre-oxygenation with 100% oxygen Induction Type: IV induction Ventilation: Mask ventilation without difficulty Laryngoscope Size: Mac and 4 Grade View: Grade II Tube type: Oral Tube size: 7.0 mm Number of attempts: 1 Airway Equipment and Method: Stylet and Oral airway Placement Confirmation: ETT inserted through vocal cords under direct vision, positive ETCO2 and breath sounds checked- equal and bilateral Secured at: 22 cm Tube secured with: Tape Dental Injury: Teeth and Oropharynx as per pre-operative assessment

## 2021-03-28 NOTE — Transfer of Care (Signed)
Immediate Anesthesia Transfer of Care Note  Patient: Sheila Barrett  Procedure(s) Performed: LEFT LUMBAR FOUR THROUGH LUMBAR FIVE DISCECTOMY (Left: Spine Lumbar)  Patient Location: PACU  Anesthesia Type:General  Level of Consciousness: awake, oriented and patient cooperative  Airway & Oxygen Therapy: Patient Spontanous Breathing and Patient connected to nasal cannula oxygen  Post-op Assessment: Report given to RN and Post -op Vital signs reviewed and stable  Post vital signs: Reviewed  Last Vitals:  Vitals Value Taken Time  BP 142/77 03/28/21 1556  Temp 37.1 C 03/28/21 1541  Pulse 89 03/28/21 1604  Resp 19 03/28/21 1604  SpO2 94 % 03/28/21 1604  Vitals shown include unvalidated device data.  Last Pain:  Vitals:   03/28/21 1556  TempSrc:   PainSc: 5       Patients Stated Pain Goal: 3 (03/28/21 1134)  Complications: No notable events documented.

## 2021-03-28 NOTE — H&P (Signed)
Chief Complaint: Left L4-5 disc herniation with radiculopathy History: Sheila Barrett is a very pleasant 45 year old woman with significant back buttock and progressive radicular left leg pain.  Imaging studies demonstrated an L4-5 disc herniation with marked compression of the L5 nerve as result of the severe radicular leg pain we elected to move forward with surgery.  Plan on L4-5 microdiscectomy.   Past Medical History:  Diagnosis Date   Anxiety    Family history of adverse reaction to anesthesia    Sister took longer to wake   GERD (gastroesophageal reflux disease)    History of kidney stones    Kidney stones     Allergies  Allergen Reactions   Keflex [Cephalexin] Hives   Cephalosporins     No current facility-administered medications on file prior to encounter.   Current Outpatient Medications on File Prior to Encounter  Medication Sig Dispense Refill   acetaminophen (TYLENOL) 325 MG tablet Take 650 mg by mouth every 6 (six) hours as needed for mild pain.     azithromycin (ZITHROMAX) 250 MG tablet Take 250-500 mg by mouth See admin instructions. 500 mg daily on day 1, then 250 mg daily for 4 days     citalopram (CELEXA) 20 MG tablet Take 20 mg by mouth daily.     gabapentin (NEURONTIN) 300 MG capsule Take 300-600 mg by mouth See admin instructions. Taking 300mg  in the AM and 300mg  at lunch and 600mg  at bedtime.     Garlic 1000 MG CAPS Take 1,000 mg by mouth daily.     omeprazole (PRILOSEC OTC) 20 MG tablet Take 20 mg by mouth daily.     traMADol (ULTRAM) 50 MG tablet Take 50 mg by mouth 2 (two) times daily as needed for moderate pain.      Physical Exam: Vitals:   03/28/21 1118  BP: (!) 149/80  Pulse: 92  Resp: 18  Temp: 98.3 F (36.8 C)  SpO2: 97%   Body mass index is 42.51 kg/m.   Sheila Barrett is a pleasant individual, who appears younger than their stated age.   She is alert and orientated 3.   No shortness of breath, chest pain.   Abdomen is soft and  non-tender, negative loss of bowel and bladder control, no rebound tenderness.   Negative: skin lesions abrasions contusions  Peripheral pulses: 2+ dorsalis pedis/posterior tibialis pulses bilaterally. Compartment soft and nontender.  Gait pattern: Normal  Assistive devices: None  Neuro: 5/5 motor strength in lower extremity bilaterally. Positive left straight leg raise test with reproduction of L5 radicular pain. Positive numbness and dysesthesias in the left L5 dermatome. 2+ deep tendon reflexes at the knee and Achilles. Negative Babinski test, no clonus  Musculoskeletal: Moderate low back pain. No SI joint pain with direct palpation.  Image: Lumbar x-rays taken today in the office ([facility]/lateral/spot lateral) were reviewed: Demonstrate normal sagittal alignment. No spondylolisthesis or scoliosis. Positive degenerative changes L4-5 and to a greater extent L5-S1.  MRI Impression: MRI of lumbar spine dated 03/08/2021. Images as well as report were personally reviewed by me. At L4-5 there is a moderate to large central to the left disc protrusion measuring 8 x 15 mm causing impingement on the descending left L5 nerve roots. At L1-2 there is a moderate to large right disc extrusion with possible impingement on the left L2 nerve roots. I do not think this is the source of her pain but rather the large L4-5 disc herniation.  A/P: Assessment: Sheila Barrett is a 45 year old  female who presented initially on 11/22 with low back pain and intermittent radicular left leg pain in L5 dermatome pattern. Despite a Medrol Dosepak her pain worsened. She was also given gabapentin and tramadol for pain an MRI was ordered. Since her last visit her pain is progress he is now having constant, severe radicular leg pain in the left L5 dermatome pattern as well as a left foot drop. MRI demonstrates a large left-sided L4-5 disc protrusion causing left L5 nerve root impingement.  Plan: We discussed treatment options including  epidural steroid injection versus surgical intervention which would be a left L4-5 discectomy. Given the size of the disc herniation I think it is unlikely that the patient would get any significant relief with an injection. Additionally her symptoms are progressive and her leg pain is constant, severe, debilitating and she now has a foot drop. The patient has expressed a desire to move forward with surgical intervention which I do think is reasonable.  Risks and benefits of surgery were discussed with the patient. These include: Infection, bleeding, death, stroke, paralysis, ongoing or worse pain, need for additional surgery, leak of spinal fluid, adjacent segment degeneration requiring additional surgery, post-operative hematoma formation that can result in neurological compromise and the need for urgent/emergent re-operation. Loss in bowel and bladder control. Injury to major vessels that could result in the need for urgent abdominal surgery to stop bleeding. Risk of deep venous thrombosis (DVT) and the need for additional treatment. Recurrent disc herniation resulting in the need for revision surgery, which could include fusion surgery (utilizing instrumentation such as pedicle screws and intervertebral cages). Additional risk: If instrumentation is used there is a risk of migration, or breakage of that hardware that could require additional surgery. We also discussed that the patient's BMI which is greater than 40 further increases her risk for wound healing complications, infection, recurrent disc herniation and need for additional surgery.  We will move forward with medical clearance from her primary care provider. She was given a form today.  We also gave her an LSO brace today.  I also reviewed discharge instructions with her today. She was given a copy.  We have also discussed the post-operative recovery period to include: bathing/showering restrictions, wound healing, activity (and driving)  restrictions, medications/pain mangement.  We have also discussed post-operative redflags to include: signs and symptoms of postoperative infection, DVT/PE.  All questions were invited and answered.

## 2021-03-28 NOTE — Progress Notes (Signed)
Pharmacy Antibiotic Note  Sheila Barrett is a 45 y.o. female admitted on 03/28/2021 with surgical prophylaxis.  Pharmacy has been consulted for Vancomycin dosing. Last Vanc pre-op at 1157. No drain.  Plan: Vancomycin 1500 mg IV x 1 at 2400 PM.  Pharmacy will sign off.    Height: 5\' 3"  (160 cm) Weight: 108.9 kg (240 lb) IBW/kg (Calculated) : 52.4  Temp (24hrs), Avg:98.3 F (36.8 C), Min:97.9 F (36.6 C), Max:98.8 F (37.1 C)  Recent Labs  Lab 03/22/21 1122  WBC 10.8*  CREATININE 0.78    Estimated Creatinine Clearance: 107.4 mL/min (by C-G formula based on SCr of 0.78 mg/dL).    Allergies  Allergen Reactions   Keflex [Cephalexin] Hives   Cephalosporins     Thank you for allowing pharmacy to be a part of this patients care.  03/24/21 03/28/2021 5:18 PM

## 2021-03-28 NOTE — Discharge Instructions (Signed)

## 2021-03-28 NOTE — Op Note (Signed)
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 female who presented my office with 2-3 month history of low back pain and left leg radiculopathy.  Imaging studies confirmed a large posterior lateral to the left disc herniation with marked compression of the L5 nerve root.  As result of the progressive radicular leg pain and the loss of quality of life we elected to move forward with surgery.  All appropriate risks, benefits, and alternatives to surgery were discussed with patient and consent was obtained  PROCEDURE DETAILS: Patient was brought into the operating room and was properly positioned on the operating room table.  After induction with general anesthesia the patient was endotracheally intubated.  A timeout was taken to confirm all important data: including patient, procedure, and the level. Teds, SCD's were applied.   The patient was turned prone onto the Wilson frame and all bony prominences were well-padded.  The back was then prepped and draped in a standard fashion.  2 needles were placed into the skin in order to mark out the incision site and an x-ray was taken.  Once we localized the L4-5 level I marked out my incision and infiltrated with quarter percent Marcaine with epinephrine.  Midline incision was made and sharp dissection was carried out down to the deep fascia.  The deep fascia was sharply incised and I stripped the paraspinal muscles to expose the L4 and L5 spinous process, lamina and facet complex.  A Penfield 4 was then placed  underneath the L4 lamina and a second x-ray was taken and read by the radiologist.  This confirmed that we are at the L4-5 level.  Using Kerrison rongeurs I performed a hemilaminotomy of L4.  Penfield 4 was used to dissect through the ligamentum flavum and I resected the ligamentum flavum 3 mm Kerrison at this point I noticed the L5 nerve root was under significantly compression and was not mobile.  I then created more lateral space resecting the medial aspect of the facet.  This allowed me to get lateral and coagulate the epidural veins.  I then was able to identify the lateral aspect of the annulus and see the large disc herniation consistent with what was seen on the preoperative MRI. I then placed a problem nerve retractor to protect the thecal sac and performed an annulotomy with a 15 blade scalpel.  Nerve hook was then used to mobilize the disc fragment and I resected 2 large fragments of disc material with a micropituitary rondure.  Collectively this was very consistent with the size of the disc herniation seen on MRI.  At this point the L5 nerve root was now very mobile and no longer under compression.  I can now easily pass my nerve hook under the L5 nerve root into the foramen.  I was also able to perform a foraminotomies with a 3 mm Kerrison rongeur to further decompress the nerve root.  I then used Epstein curettes to resect some of the thickened annulus and osteophyte that was more central.  This was then removed with a micropituitary rongeurs.  This  point I could freely pass my nerve hook underneath the nerve out into the foramen and immediately underneath the thecal sac the annulus was reduced.  I could also pass my nerve hook superiorly into the lateral recess.  I was pleased with the circumferential decompression of the nerve roots and it was very mobile it is no longer under compression I then irrigated the wound copiously with normal saline and obtain hemostasis using bipolar cautery.   Thrombin-soaked Gelfoam patty was then placed over the laminotomy site and the retractor was removed.  Deep fascia was then closed with interrupted #1 Vicryl suture, superficial with 2-0 Vicryl suture and the skin with 3-0 Monocryl.  Steri-Strips and dry dressings were applied and the patient was ultimately extubated transferred to PACU without incident.    Venita Lick, MD 03/28/2021 2:59 PM

## 2021-03-29 ENCOUNTER — Encounter (HOSPITAL_COMMUNITY): Payer: Self-pay | Admitting: Orthopedic Surgery

## 2021-03-29 DIAGNOSIS — M5116 Intervertebral disc disorders with radiculopathy, lumbar region: Secondary | ICD-10-CM | POA: Diagnosis not present

## 2021-03-29 NOTE — Progress Notes (Signed)
Discharge instructions/education/AVS/Rx given to patient and verbalized understanding. Questions been answered appropriately and patient is satisfied and agreeable. MAE and ambulating well independently. Voiding and emptying bladder well. Pain is mild to moderate and controlled by pain meds. Patient awaiting for transport.

## 2021-03-29 NOTE — Evaluation (Signed)
Physical Therapy Evaluation Patient Details Name: Sheila Barrett MRN: [id number] DOB: 1975/08/27 Today's Date: 03/29/2021  History of Present Illness  Pt is a 45 y.o. female s/p L4/5 microdiscectomy.  Clinical Impression  PT eval complete. Supervision provided for transfers and ambulation 300' without AD. Increased time to complete functional mobility skills due to pain, but no LOB or instability noted. Ascend/descend 3 steps with rail supervision. Pt plans to stay with her sister upon d/c. No further skilled PT intervention indicated. Plan is for d/c home today. PT signing off.        Recommendations for follow up therapy are one component of a multi-disciplinary discharge planning process, led by the attending physician.  Recommendations may be updated based on patient status, additional functional criteria and insurance authorization.  Follow Up Recommendations No PT follow up    Assistance Recommended at Discharge Intermittent Supervision/Assistance  Functional Status Assessment Patient has had a recent decline in their functional status and demonstrates the ability to make significant improvements in function in a reasonable and predictable amount of time.  Equipment Recommendations  None recommended by PT    Recommendations for Other Services       Precautions / Restrictions Precautions Precautions: Back Precaution Booklet Issued: Yes (comment) Precaution Comments: Educated on 3/3 back precautions. Handout provided. Required Braces or Orthoses: Spinal Brace Spinal Brace: Lumbar corset;Applied in sitting position      Mobility  Bed Mobility Overal bed mobility: Modified Independent             General bed mobility comments: good logroll technique    Transfers Overall transfer level: Needs assistance Equipment used: None Transfers: Sit to/from Stand Sit to Stand: Supervision                Ambulation/Gait Ambulation/Gait assistance: Supervision Gait  Distance (Feet): 300 Feet Assistive device: None Gait Pattern/deviations: Step-through pattern;Decreased stride length;Wide base of support Gait velocity: mildly decreased Gait velocity interpretation: >2.62 ft/sec, indicative of community ambulatory   General Gait Details: steady gait without AD  Stairs Stairs: Yes Stairs assistance: Supervision Stair Management: One rail Right;Sideways;Step to pattern Number of Stairs: 3    Wheelchair Mobility    Modified Rankin (Stroke Patients Only)       Balance Overall balance assessment: No apparent balance deficits (not formally assessed)                                           Pertinent Vitals/Pain Pain Assessment: 0-10 Pain Score: 6  Pain Location: back, L hip Pain Descriptors / Indicators: Grimacing;Cramping;Discomfort Pain Intervention(s): Monitored during session;Repositioned    Home Living Family/patient expects to be discharged to:: Private residence Living Arrangements: Children (12 y.o. daughter) Available Help at Discharge: Family;Available 24 hours/day Type of Home: House Home Access: Stairs to enter Entrance Stairs-Rails: Doctor, general practice of Steps: 3   Home Layout: One level Home Equipment: None      Prior Function Prior Level of Function : Independent/Modified Independent                     Hand Dominance        Extremity/Trunk Assessment   Upper Extremity Assessment Upper Extremity Assessment: Overall WFL for tasks assessed    Lower Extremity Assessment Lower Extremity Assessment: Overall WFL for tasks assessed    Cervical / Trunk Assessment Cervical / Trunk Assessment: Back Surgery  Communication   Communication: No difficulties  Cognition Arousal/Alertness: Awake/alert Behavior During Therapy: WFL for tasks assessed/performed Overall Cognitive Status: Within Functional Limits for tasks assessed                                           General Comments      Exercises     Assessment/Plan    PT Assessment Patient does not need any further PT services  PT Problem List         PT Treatment Interventions      PT Goals (Current goals can be found in the Care Plan section)  Acute Rehab PT Goals Patient Stated Goal: home PT Goal Formulation: All assessment and education complete, DC therapy    Frequency     Barriers to discharge        Co-evaluation               AM-PAC PT "6 Clicks" Mobility  Outcome Measure Help needed turning from your back to your side while in a flat bed without using bedrails?: None Help needed moving from lying on your back to sitting on the side of a flat bed without using bedrails?: None Help needed moving to and from a bed to a chair (including a wheelchair)?: A Little Help needed standing up from a chair using your arms (e.g., wheelchair or bedside chair)?: A Little Help needed to walk in hospital room?: A Little Help needed climbing 3-5 steps with a railing? : A Little 6 Click Score: 20    End of Session Equipment Utilized During Treatment: Back brace Activity Tolerance: Patient tolerated treatment well Patient left: in bed;with call bell/phone within reach Nurse Communication: Mobility status PT Visit Diagnosis: Difficulty in walking, not elsewhere classified (R26.2);Pain    Time: 2952-8413 PT Time Calculation (min) (ACUTE ONLY): 14 min   Charges:   PT Evaluation $PT Eval Moderate Complexity: 1 Mod          Aida Raider, PT  Office # [phone] Pager [phone]   Ilda Foil 03/29/2021, 8:37 AM

## 2021-03-29 NOTE — Progress Notes (Signed)
Subjective: 1 Day Post-Op Procedure(s) (LRB): LEFT LUMBAR FOUR THROUGH LUMBAR FIVE DISCECTOMY (Left) Patient reports pain as mild.  Radicular leg pain improved. Some muscle spaasms in back. Tolerating PO without N/V +void +flatus +ambulation  Objective: Vital signs in last 24 hours: Temp:  [97.9 F (36.6 C)-98.8 F (37.1 C)] 98.3 F (36.8 C) (12/29 0742) Pulse Rate:  [83-94] 83 (12/29 0742) Resp:  [10-20] 16 (12/29 0742) BP: (111-150)/(67-80) 122/71 (12/29 0742) SpO2:  [93 %-99 %] 96 % (12/29 0742) Weight:  [108.9 kg] 108.9 kg (12/28 1118)  Intake/Output from previous day: 12/28 0701 - 12/29 0700 In: 2190.1 [P.O.:390; I.V.:1400; IV Piggyback:400.1] Out: 15 [Blood:15] Intake/Output this shift: No intake/output data recorded.  No results for input(s): HGB in the last 72 hours. No results for input(s): WBC, RBC, HCT, PLT in the last 72 hours. No results for input(s): NA, K, CL, CO2, BUN, CREATININE, GLUCOSE, CALCIUM in the last 72 hours. No results for input(s): LABPT, INR in the last 72 hours.  Neurologically intact ABD soft Neurovascular intact Sensation intact distally Intact pulses distally Dorsiflexion/Plantar flexion intact Incision: dressing C/D/I No cellulitis present Compartment soft   Assessment/Plan: 1 Day Post-Op Procedure(s) (LRB): LEFT LUMBAR FOUR THROUGH LUMBAR FIVE DISCECTOMY (Left) Advance diet Up with therapy LSO when OOB DVT Ppx: Teds, SCDs, ambulation IS Encouraged.  Plan D/C today.  F/u in 2 weeks.   Scripts in chart. I will send Gabapentin electronically. We discussed weaning her off of this over the next week since her leg pain is improved.    Rhodia Albright 03/29/2021, 8:23 AM

## 2021-03-30 NOTE — Discharge Summary (Signed)
Patient ID: Sheila Barrett MRN: [id number] DOB/AGE: March 24, 1976 45 y.o.  Admit date: 03/28/2021 Discharge date: 03/30/2021  Admission Diagnoses:  Principal Problem:   Lumbar disc herniation   Discharge Diagnoses:  Principal Problem:   Lumbar disc herniation  status post Procedure(s): LEFT LUMBAR FOUR THROUGH LUMBAR FIVE DISCECTOMY  Past Medical History:  Diagnosis Date   Anxiety    Family history of adverse reaction to anesthesia    Sister took longer to wake   GERD (gastroesophageal reflux disease)    History of kidney stones    Kidney stones     Surgeries: Procedure(s): LEFT LUMBAR FOUR THROUGH LUMBAR FIVE DISCECTOMY on 03/28/2021   Consultants:   Discharged Condition: Improved  Hospital Course: Sheila Barrett is an 45 y.o. female who was admitted 03/28/2021 for operative treatment of Lumbar disc herniation. Patient failed conservative treatments (please see the history and physical for the specifics) and had severe unremitting pain that affects sleep, daily activities and work/hobbies. After pre-op clearance, the patient was taken to the operating room on 03/28/2021 and underwent  Procedure(s): LEFT LUMBAR FOUR THROUGH LUMBAR FIVE DISCECTOMY.    Patient was given perioperative antibiotics:  Anti-infectives (From admission, onward)    Start     Dose/Rate Route Frequency Ordered Stop   03/29/21 0000  vancomycin (VANCOREADY) IVPB 1500 mg/300 mL        1,500 mg 150 mL/hr over 120 Minutes Intravenous  Once 03/28/21 1730 03/29/21 0203   03/28/21 1120  vancomycin (VANCOREADY) IVPB 1500 mg/300 mL        1,500 mg 150 mL/hr over 120 Minutes Intravenous 120 min pre-op 03/28/21 1120 03/28/21 1357        Patient was given sequential compression devices and early ambulation to prevent DVT.   Patient benefited maximally from hospital stay and there were no complications. At the time of discharge, the patient was urinating/moving their bowels without difficulty, tolerating a  regular diet, pain is controlled with oral pain medications and they have been cleared by PT/OT.   Recent vital signs: No data found.   Recent laboratory studies: No results for input(s): WBC, HGB, HCT, PLT, NA, K, CL, CO2, BUN, CREATININE, GLUCOSE, INR, CALCIUM in the last 72 hours.  Invalid input(s): PT, 2   Discharge Medications:   Allergies as of 03/29/2021       Reactions   Keflex [cephalexin] Hives   Cephalosporins         Medication List     STOP taking these medications    acetaminophen 325 MG tablet Commonly known as: TYLENOL   azithromycin 250 MG tablet Commonly known as: ZITHROMAX   Garlic 1000 MG Caps   traMADol 50 MG tablet Commonly known as: ULTRAM       TAKE these medications    citalopram 20 MG tablet Commonly known as: CELEXA Take 20 mg by mouth daily.   gabapentin 300 MG capsule Commonly known as: NEURONTIN Take 300-600 mg by mouth See admin instructions. Taking 300mg  in the AM and 300mg  at lunch and 600mg  at bedtime.   methocarbamol 500 MG tablet Commonly known as: Robaxin Take 1 tablet (500 mg total) by mouth every 8 (eight) hours as needed for up to 5 days for muscle spasms.   omeprazole 20 MG tablet Commonly known as: PRILOSEC OTC Take 20 mg by mouth daily.   ondansetron 4 MG tablet Commonly known as: Zofran Take 1 tablet (4 mg total) by mouth every 8 (eight) hours as needed for nausea or vomiting.  oxyCODONE-acetaminophen 10-325 MG tablet Commonly known as: Percocet Take 1 tablet by mouth every 6 (six) hours as needed for up to 5 days for pain.        Diagnostic Studies: DG Lumbar Spine 2-3 Views  Result Date: 03/28/2021 CLINICAL DATA:  L4-5 discectomy. Evaluate lumbar level localization. EXAM: LUMBAR SPINE - 2-3 VIEW COMPARISON:  None. FINDINGS: Per request the lumbar spine levels have been labeled. On the second image obtained at 1353 hours on 02/26/2021 the surgical probe is posterior to the L4-5 disc space. There are  also tissue spreaders identified posterior to L4-5. IMPRESSION: On the second image obtained at 1353 hours there is a surgical probe with tip posterior to the L4-5 disc space. These findings were called to the ordering provider in the operating room at 1413 hours on 03/28/2021 Electronically Signed   By: Signa Kell M.D.   On: 03/28/2021 14:14    Discharge Instructions     Incentive spirometry RT   Complete by: As directed         Follow-up Information     Venita Lick, MD. Schedule an appointment as soon as possible for a visit in 2 week(s).   Specialty: Orthopedic Surgery Why: If symptoms worsen, For suture removal, For wound re-check Contact information: [address] [place] [address] [phone]                 Discharge Plan:  discharge to home  Disposition: stable    Signed: Rhodia Albright for [facility] PA-C [facility] [phone] 03/30/2021, 5:09 PM

## 2021-04-17 ENCOUNTER — Ambulatory Visit: Payer: PRIVATE HEALTH INSURANCE | Admitting: Obstetrics & Gynecology

## 2021-05-15 ENCOUNTER — Ambulatory Visit: Payer: PRIVATE HEALTH INSURANCE | Admitting: Obstetrics & Gynecology

## 2022-02-28 ENCOUNTER — Encounter: Payer: Self-pay | Admitting: Neurology

## 2022-03-11 IMAGING — MG MM DIGITAL SCREENING BILAT W/ TOMO AND CAD
6 of 10 series · 6 of 30 positions shown · non-contrast
Comparison: None.

CLINICAL DATA: Screening. This is the patient's initial baseline
mammogram.

EXAM:
DIGITAL SCREENING BILATERAL MAMMOGRAM WITH TOMOSYNTHESIS AND CAD
TECHNIQUE: Bilateral screening digital craniocaudal and mediolateral oblique
mammograms were obtained. Bilateral screening digital breast
tomosynthesis was performed. The images were evaluated with
computer-aided detection.

[R CC synth-2D (1 of 2)]
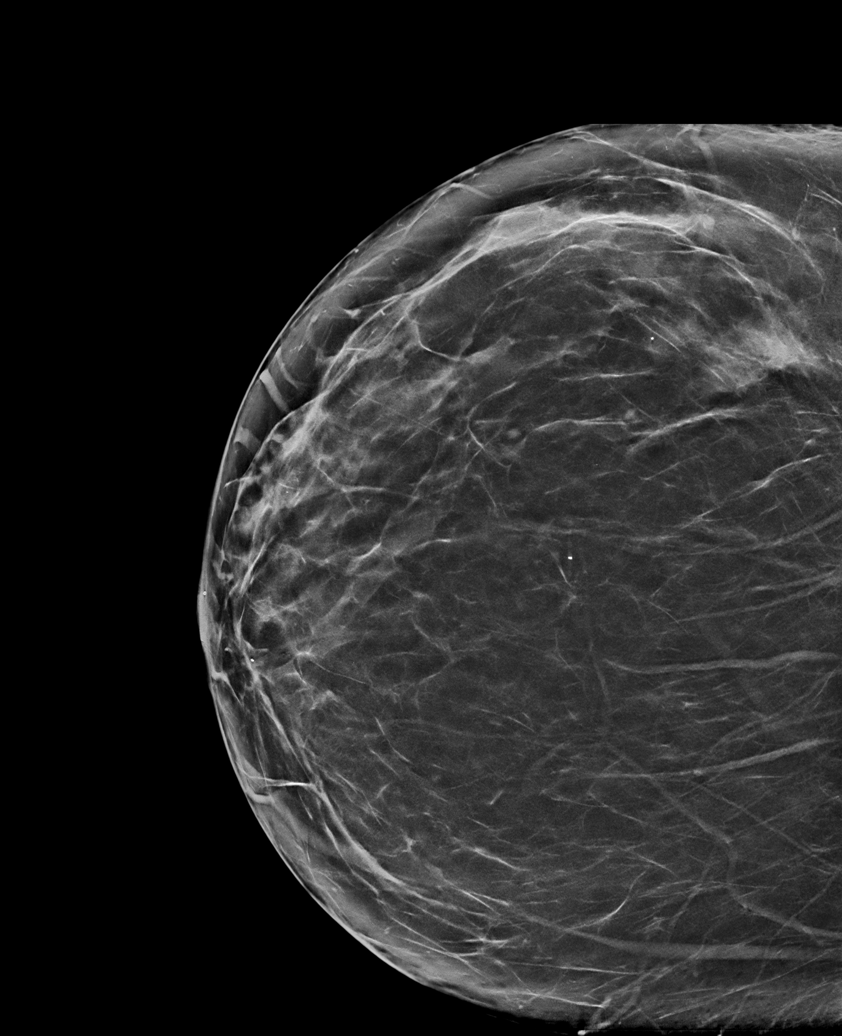

[R MLO synth-2D]
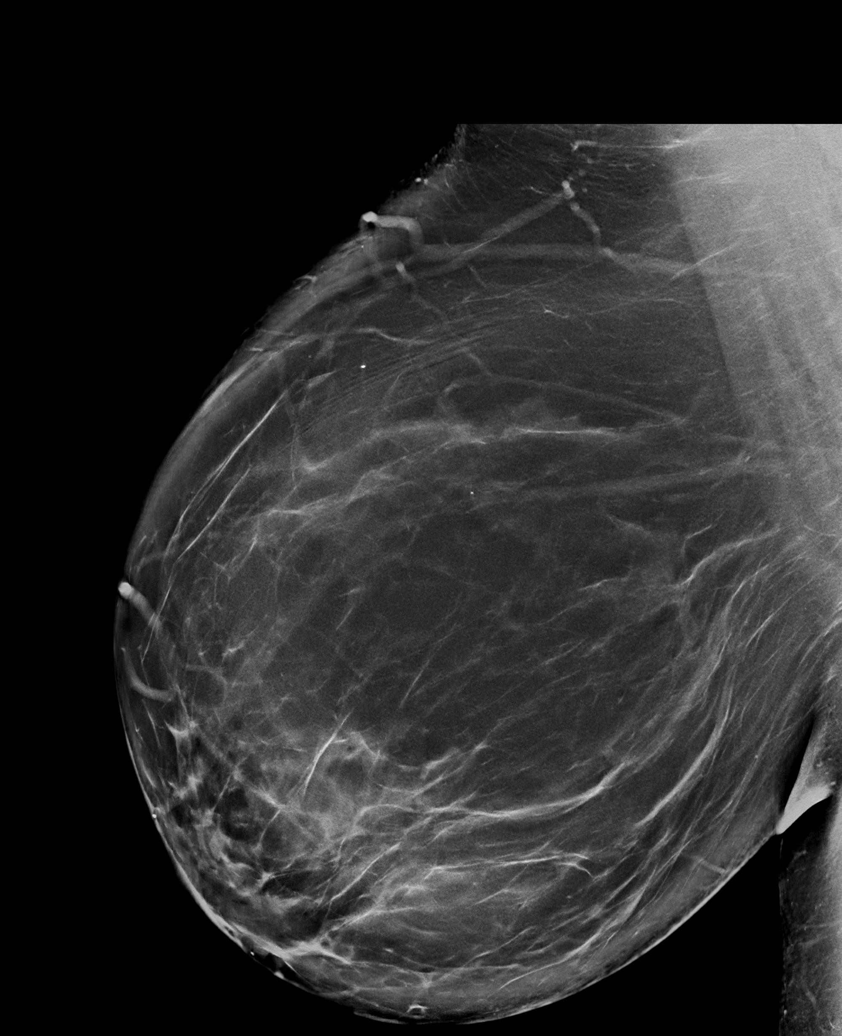

[R CC synth-2D (2 of 2)]
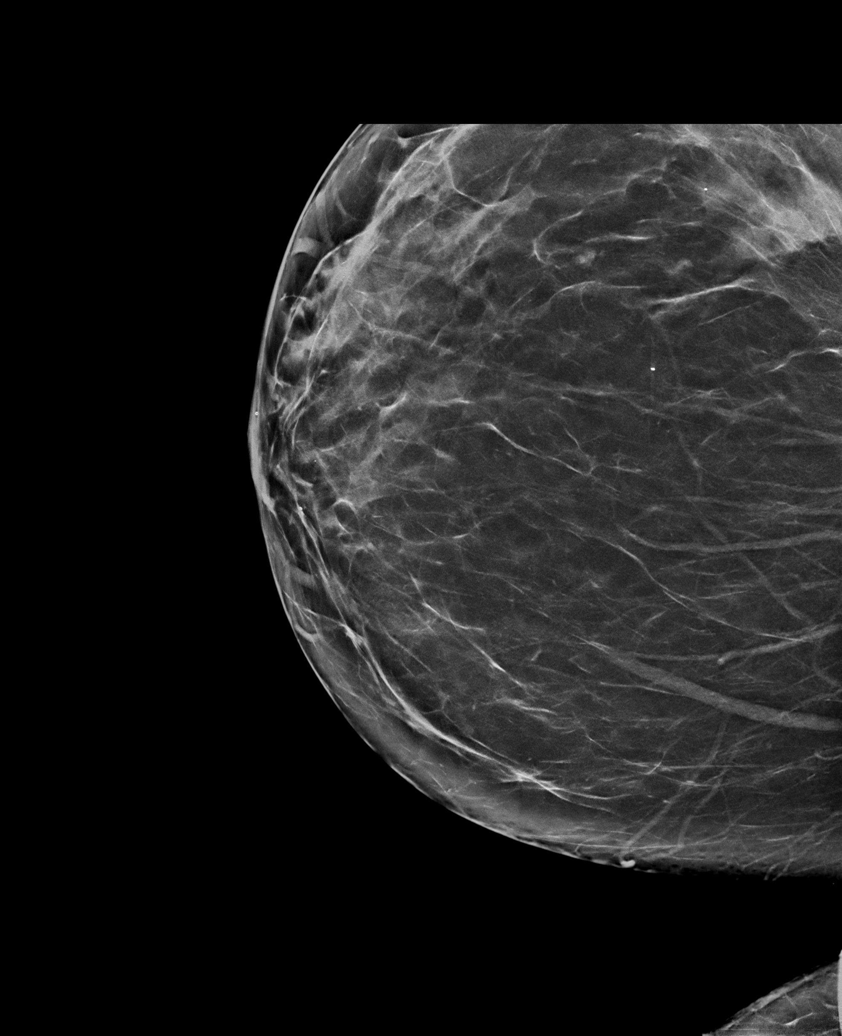

[L CC synth-2D]
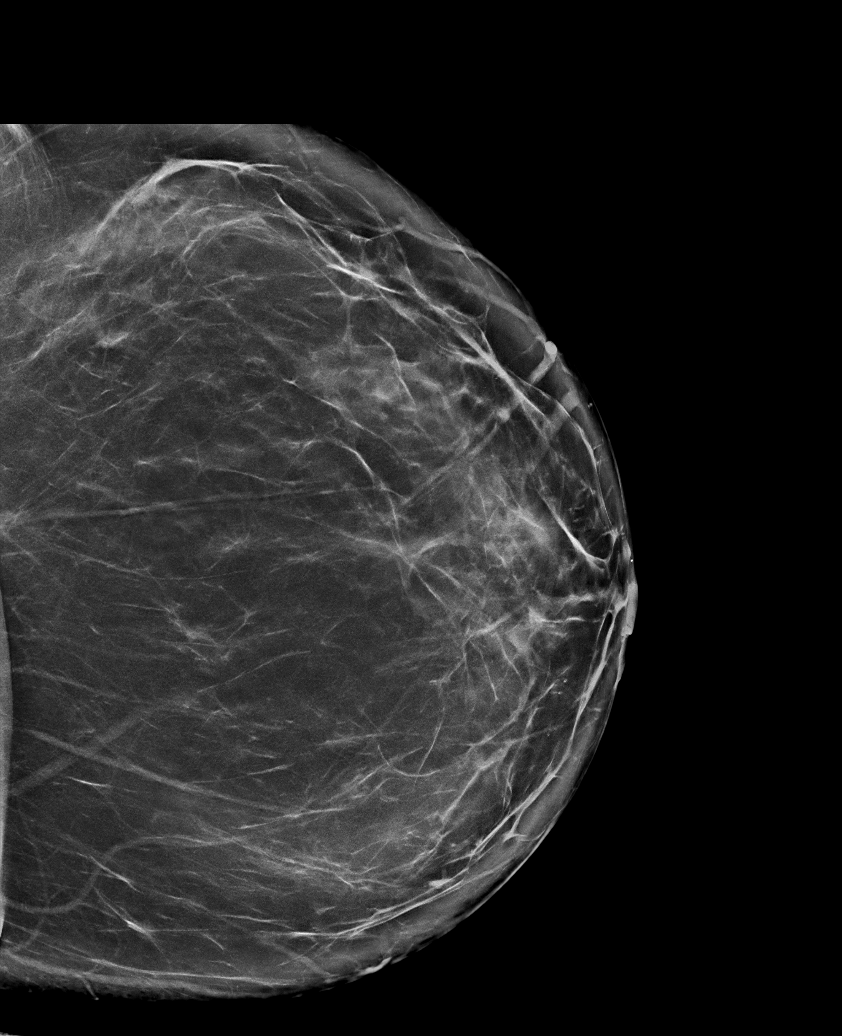

[L MLO synth-2D]
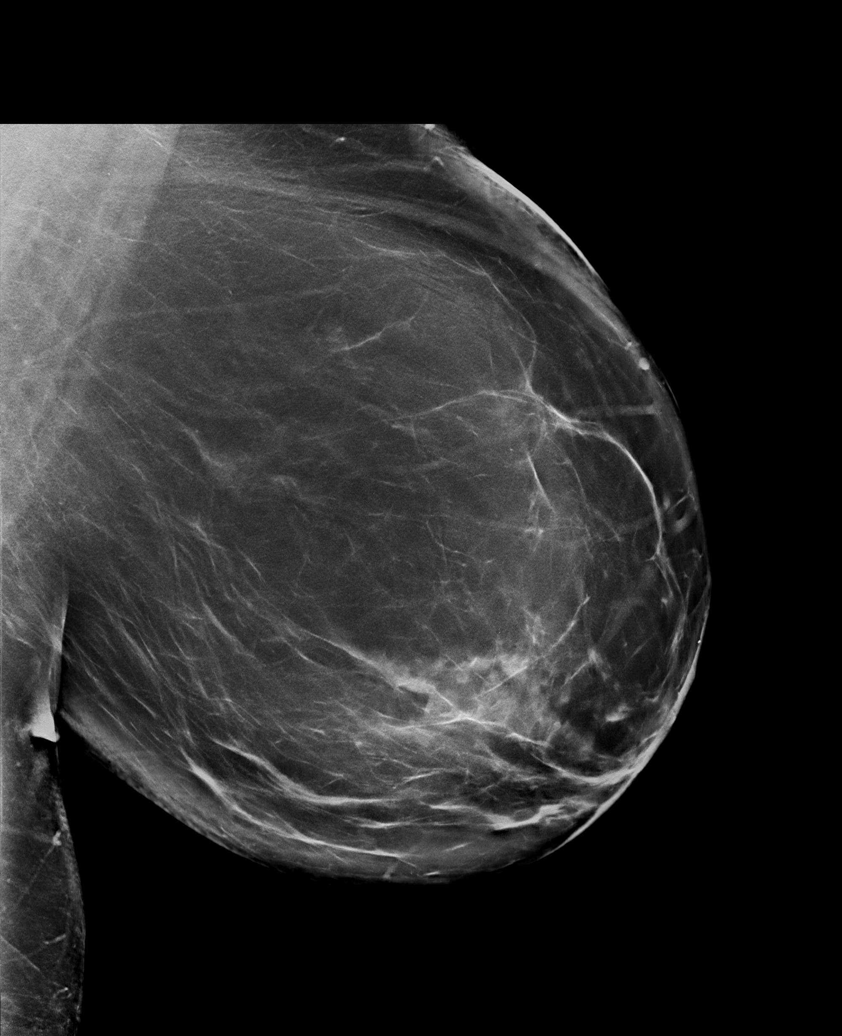

[R MLO tomo · tomo slice 59/116.0]
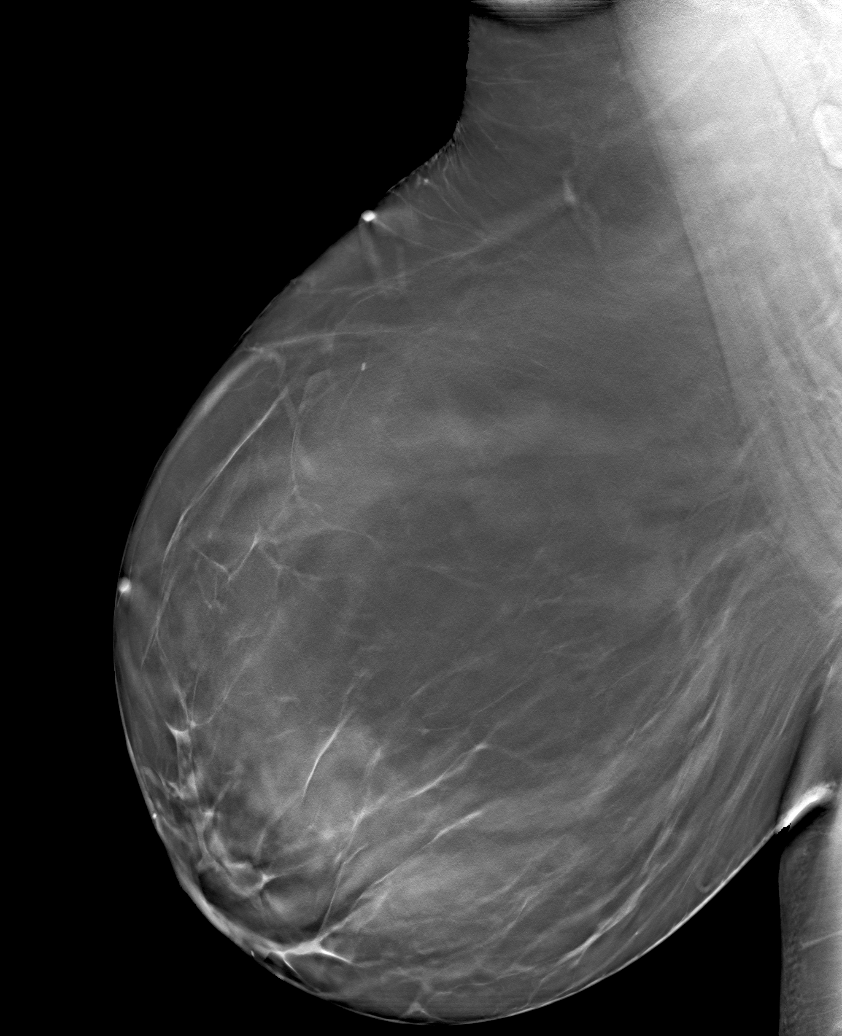

[6 of 30 positions shown; findings below may reference images not displayed]

ACR Breast Density Category b: There are scattered areas of
fibroglandular density.
FINDINGS: There are no findings suspicious for malignancy. The images were
evaluated with computer-aided detection.
IMPRESSION: No mammographic evidence of malignancy. A result letter of this
screening mammogram will be mailed directly to the patient.

RECOMMENDATION:
Screening mammogram in one year. (Code:[id number]-M-0EU)

BI-RADS CATEGORY  1: Negative.

## 2022-03-27 ENCOUNTER — Telehealth: Payer: Self-pay | Admitting: Neurology

## 2022-03-27 ENCOUNTER — Ambulatory Visit (INDEPENDENT_AMBULATORY_CARE_PROVIDER_SITE_OTHER): Payer: Self-pay | Admitting: Neurology

## 2022-03-27 DIAGNOSIS — Z91199 Patient's noncompliance with other medical treatment and regimen due to unspecified reason: Secondary | ICD-10-CM

## 2022-03-27 NOTE — Progress Notes (Signed)
     PATIENT NO SHOWED HER APPOINTMENT BELOW IS PRECHARTING FOR FUTURE USE    Provider:  Dr Lucia Gaskins Requesting Provider: [facility] Primary Care Provider:  Assunta Found, MD  CC:  NO ShOW  HPI:  Sheila Barrett is a 46 y.o. female here as requested by [facility] for "idiopathis trochlear nerve palsy". PMHx lumbar disc herniation, anxiety, vertigo, uses alcohol every other day, .  I reviewed notes from [facility] health care, she has vertigo on meclizine, she is also on Celexa and Zofran and Prilosec.  I reviewed Dr. Elenora Fender 's notes DO from 1128, she presented with vertigo, binocular disorder with diplopia.  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  Physical examination was normal,she continued nausea and vomiting despite iv valium and zofran. Increased dizziness with movement. Went to vestibular PT. I don't see a neurologic exam or anyplace with documented ophthalmopegia.   Reviewed notes, labs and imaging from outside physicians, which showed   02/27/2022: TSH nml 02/26/2022: leukocytosis 12.3, hgb 11.4 otherwise unremarkable,  02/27/2022: cmp cl 108, bun 7, creat 0.78, calcium 7.6, alb 2.5, protein 5.5, bili 0.2 otherwise unremarkable Glucose 118 Drug screen neg Preg neg. Urine without infection   Trace ketones and blood, few bacteria, otherwise unremarkable,   CTA H&N "normal CT Angiography of the head and neck"   I reviewed [facility]  MRI of the brain with and without contrast, reviewed was normal "no cause for dizziness identified"   Patient saw PT, reviewed,  and was found to have left torsional nystagmus on dix-hallpike

## 2022-03-27 NOTE — Telephone Encounter (Signed)
Patient No showed appointment today with Dr. Lucia Gaskins do not reschedule here per Dr. Lucia Gaskins. Patient has an upcoming appointment with Dr. Everlena Cooper at [facility] 06/2021.

## 2022-05-07 ENCOUNTER — Encounter: Payer: Self-pay | Admitting: Neurology

## 2022-05-07 ENCOUNTER — Ambulatory Visit (INDEPENDENT_AMBULATORY_CARE_PROVIDER_SITE_OTHER): Payer: Managed Care, Other (non HMO) | Admitting: Neurology

## 2022-05-07 VITALS — BP 146/94 | HR 107 | Ht 63.0 in | Wt 247.4 lb

## 2022-05-07 DIAGNOSIS — R519 Headache, unspecified: Secondary | ICD-10-CM

## 2022-05-07 DIAGNOSIS — H532 Diplopia: Secondary | ICD-10-CM

## 2022-05-07 DIAGNOSIS — H4720 Unspecified optic atrophy: Secondary | ICD-10-CM | POA: Diagnosis not present

## 2022-05-07 DIAGNOSIS — R270 Ataxia, unspecified: Secondary | ICD-10-CM

## 2022-05-07 DIAGNOSIS — R292 Abnormal reflex: Secondary | ICD-10-CM

## 2022-05-07 DIAGNOSIS — R200 Anesthesia of skin: Secondary | ICD-10-CM

## 2022-05-07 DIAGNOSIS — R258 Other abnormal involuntary movements: Secondary | ICD-10-CM

## 2022-05-07 DIAGNOSIS — G1229 Other motor neuron disease: Secondary | ICD-10-CM

## 2022-05-07 DIAGNOSIS — H4911 Fourth [trochlear] nerve palsy, right eye: Secondary | ICD-10-CM | POA: Insufficient documentation

## 2022-05-07 DIAGNOSIS — H499 Unspecified paralytic strabismus: Secondary | ICD-10-CM

## 2022-05-07 DIAGNOSIS — R202 Paresthesia of skin: Secondary | ICD-10-CM

## 2022-05-07 DIAGNOSIS — R42 Dizziness and giddiness: Secondary | ICD-10-CM | POA: Diagnosis not present

## 2022-05-07 DIAGNOSIS — R5383 Other fatigue: Secondary | ICD-10-CM

## 2022-05-07 DIAGNOSIS — R531 Weakness: Secondary | ICD-10-CM

## 2022-05-07 DIAGNOSIS — G379 Demyelinating disease of central nervous system, unspecified: Secondary | ICD-10-CM

## 2022-05-07 NOTE — Progress Notes (Signed)
[facility]    Provider:  Dr Jaynee Eagles Requesting Provider: Sharilyn Sites, MD Primary Care Provider:  Sharilyn Sites, MD  CC:  diplopia, vertigo  HPI:  Sheila Barrett is a 47 y.o. female here as requested by Sharilyn Sites, MD for diplopia and vertigo  She got up in the morning and whole room was spinning. She called EMS and spent 3 days in the hospital and then resolved. Discharged still with double vision and lasted for 3 weeks. Double vision went away. Some dizziness but resolved. Diplopia still ongoing. She was diagnosed with idiopathic trochlear nerve palsy. Never found out out. Had vestibular therapy and it didn;t really help, she still have imbalance, occ dizziness she has diplopia extreme fatigue. She is still concerned about continuing symptoms. She is ataxia. She is worried about MS. Numbness both arms, vertigo, weakness, extreme fatigue, no urination/bowel problems, double vision, she still having dip;opia if she looks down she still visual disturbances. She went to the eye doctor dec 2023, optometrist. Also 3 headaches a week. No other focal neurologic deficits, associated symptoms, inciting events or modifiable factors.  Reviewed notes, labs and imaging from outside physicians, which showed:  Notes from hospital stay, acute vertigo necessitating a 3-week stay inpatient, diplopia, trochlear nerve palsy, workup was unremarkable, diagnosed with idiopathic trochlear nerve palsy and unidentified reason for the vertigo.  She was asked to follow-up with outpatient neurology.     Latest Ref Rng & Units 03/22/2021   11:22 AM 10/25/2019    8:34 AM 11/19/2012    8:05 AM  CBC  WBC 4.0 - 10.5 K/uL 10.8  8.6  8.6   Hemoglobin 12.0 - 15.0 g/dL 14.0  13.1  12.9   Hematocrit 36.0 - 46.0 % 42.9  40.9  38.3   Platelets 150 - 400 K/uL 268  233  202       Latest Ref Rng & Units 03/22/2021   11:22 AM 10/25/2019    8:34 AM 11/19/2012    8:05 AM  CMP  Glucose 70 - 99 mg/dL 86  98   96   BUN 6 - 20 mg/dL 11  11  10    Creatinine 0.44 - 1.00 mg/dL 0.78  0.70  0.74   Sodium 135 - 145 mmol/L 137  137  137   Potassium 3.5 - 5.1 mmol/L 3.7  3.8  3.7   Chloride 98 - 111 mmol/L 101  102  101   CO2 22 - 32 mmol/L 28  24  28    Calcium 8.9 - 10.3 mg/dL 9.2  8.9  9.1   Total Protein 6.5 - 8.1 g/dL  6.8  6.8   Total Bilirubin 0.3 - 1.2 mg/dL  0.5  0.3   Alkaline Phos 38 - 126 U/L  50  68   AST 15 - 41 U/L  18  14   ALT 0 - 44 U/L  22  21    MRI brain: MRI HEAD WITHOUT AND WITH CONTRAST  TECHNIQUE: Multiplanar, multiecho pulse sequences of the brain and surrounding structures were obtained without and with intravenous contrast.  CONTRAST:  10 ml Gadavist  COMPARISON:  CTA head/neck angiogram 02/25/22  FINDINGS: Brain: No acute infarction, hemorrhage, hydrocephalus, extra-axial collection or mass lesion.  Vascular: Normal flow voids.  Skull and upper cervical spine: Normal marrow signal.  Sinuses/Orbits: Negative.  Other: None. Procedure Note  Ricke Hey, MD - 02/26/2022 Formatting of this note might be different from the original. CLINICAL DATA:  Persistent dizziness  EXAM: MRI HEAD WITHOUT AND WITH CONTRAST: 02/26/2022  TECHNIQUE: Multiplanar, multiecho pulse sequences of the brain and surrounding structures were obtained without and with intravenous contrast.  CONTRAST:  10 ml Gadavist  COMPARISON:  CTA head/neck angiogram 02/25/22  FINDINGS: Brain: No acute infarction, hemorrhage, hydrocephalus, extra-axial collection or mass lesion.  Vascular: Normal flow voids.  Skull and upper cervical spine: Normal marrow signal.  Sinuses/Orbits: Negative.  Other: None.  IMPRESSION: No cause for dizziness identified.  CTA H&N: 02/26/2023: 1. Normal head CT.  2. Normal CT angiography of the head and neck.   Review of Systems: Patient complains of symptoms per HPI as well as the following symptoms double vision. Pertinent negatives and  positives per HPI. All others negative.   Social History   Socioeconomic History   Marital status: Single    Spouse name: Not on file   Number of children: 1   Years of education: Not on file   Highest education level: Not on file  Occupational History   Not on file  Tobacco Use   Smoking status: Never   Smokeless tobacco: Never  Vaping Use   Vaping Use: Never used  Substance and Sexual Activity   Alcohol use: Yes    Comment: occasionally   Drug use: Never   Sexual activity: Yes    Birth control/protection: Surgical  Other Topics Concern   Not on file  Social History Narrative   ** Merged History Encounter **    Caffiene 2 cups daily   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)   Social Determinants of Health   Financial Resource Strain: Not on file  Food Insecurity: Not on file  Transportation Needs: Not on file  Physical Activity: Not on file  Stress: Not on file  Social Connections: Not on file  Intimate Partner Violence: Not on file    Family History  Problem Relation Age of Onset   Cancer Mother        lung    Past Medical History:  Diagnosis Date   Anxiety    Family history of adverse reaction to anesthesia    Sister took longer to wake   GERD (gastroesophageal reflux disease)    History of kidney stones    Kidney stones    Medullary sponge kidney of both kidneys     Patient Active Problem List   Diagnosis Date Noted   Diplopia 05/07/2022   Vertigo 05/07/2022   Right trochlear nerve palsy determined by examination 05/07/2022   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   Contraceptive management 10/13/2019    Past Surgical History:  Procedure Laterality Date   CESAREAN SECTION     2007   HYSTEROSCOPY WITH D & C N/A 10/26/2019   Procedure: DILATATION AND CURETTAGE /HYSTEROSCOPY/ABLATION WITH MINERVA;  Surgeon: Tilda Burrow, MD;  Location: [facility];  Service: Gynecology;  Laterality: N/A;   LAPAROSCOPIC BILATERAL SALPINGECTOMY Bilateral 10/26/2019    Procedure: LAPAROSCOPIC BILATERAL SALPINGECTOMY;  Surgeon: Tilda Burrow, MD;  Location: [facility];  Service: Gynecology;  Laterality: Bilateral;   LUMBAR LAMINECTOMY/DECOMPRESSION MICRODISCECTOMY Left 03/28/2021   Procedure: LEFT LUMBAR FOUR THROUGH LUMBAR FIVE DISCECTOMY;  Surgeon: Venita Lick, MD;  Location: [facility];  Service: Orthopedics;  Laterality: Left;  3 hrs 3 C-Bed   TUBAL LIGATION      Current Outpatient Medications  Medication Sig Dispense Refill   acyclovir (ZOVIRAX) 400 MG tablet Take 400 mg by mouth as needed (for cold sore outbreak).     azithromycin (ZITHROMAX) 250 MG  tablet Take 250 mg by mouth daily.     citalopram (CELEXA) 20 MG tablet Take 20 mg by mouth daily.     diazepam (VALIUM) 2 MG tablet Take 2 mg by mouth every 6 (six) hours as needed.     methocarbamol (ROBAXIN) 500 MG tablet Take 500 mg by mouth daily as needed.     omeprazole (PRILOSEC OTC) 20 MG tablet Take 20 mg by mouth daily.     ondansetron (ZOFRAN) 4 MG tablet Take 1 tablet (4 mg total) by mouth every 8 (eight) hours as needed for nausea or vomiting. 20 tablet 0   No current facility-administered medications for this visit.    Allergies as of 05/07/2022 - Review Complete 05/07/2022  Allergen Reaction Noted   Keflex [cephalexin] Hives 03/10/2019   Cephalosporins  11/19/2012    Vitals: BP (!) 146/94   Pulse (!) 107   Ht 5\' 3"  (1.6 m)   Wt 247 lb 6.4 oz (112.2 kg)   BMI 43.82 kg/m  Last Weight:  Wt Readings from Last 1 Encounters:  05/07/22 247 lb 6.4 oz (112.2 kg)   Last Height:   Ht Readings from Last 1 Encounters:  05/07/22 5\' 3"  (1.6 m)     Physical exam: Exam: Gen: NAD, conversant, well nourised, obese, well groomed                     CV: RRR, no MRG. No Carotid Bruits. No peripheral edema, warm, nontender Eyes: Conjunctivae clear without exudates or hemorrhage  Neuro: Detailed Neurologic Exam  Speech:    Speech is normal; fluent and spontaneous with normal  comprehension.  Cognition:    The patient is oriented to person, place, and time;     recent and remote memory intact;     language fluent;     normal attention, concentration,     fund of knowledge Cranial Nerves:    The pupils are equal, round, and reactive to light. Pale optic nerve right?Visual fields are full to finger confrontation. Extraocular movements are intact. Trigeminal sensation is intact and the muscles of mastication are normal. The face is symmetric. The palate elevates in the midline. Hearing intact. Voice is normal. Shoulder shrug is normal. The tongue has normal motion without fasciculations.   Coordination: nml  Gait: imbalance  Motor Observation:    No asymmetry, no atrophy, and no involuntary movements noted. Tone:    Normal muscle tone.    Posture:    Posture is normal. normal erect    Strength: left mild biceps weakness. Otherwise strength is V/V in the upper and lower limbs.      Sensation: intact to LT     Reflex Exam:  DTR's:    biceps slightly hyperreflexic.  Toes:    The toes are downgoing bilaterally.   Clonus:    Clonus is absent. 2 beats clonus left AJ    Assessment/Plan: This is a very unusual history of patient having subacute vertigo and diplopia with trochlear nerve palsy.  She was admitted for 3 days.  MRI of the brain did not show etiology, she did not have MRI of the orbits or MRI of the cervical spine.  Undiagnosed etiology of vertigo and diagnosed with idiopathic trochlear nerve palsy.  Examination showed 2 beats clonus left AJ.  Left biceps slightly hyperreflexive, asymmetric DTRs.  The right optic nerve appears pale as compared to the left.  On exam she has diplopia with upgaze, clonus and hyperreflexia.  Patient  needs a thorough evaluation for possible stroke that was missed, optic neuritis, demyelinating disease which is most concerning for these types of symptoms.  - Pale optic nerve right as compared to left?, trochlear palsy and  diplopia: Dr. Katy Fitch for evaluation - MRI brain/orbits/c-spine due to concerning symptoms as above: patient needs a thorough evaluation for possible stroke that was missed, optic neuritis, demyelinating disease which is most concerning for these types of symptoms.   Orders Placed This Encounter  Procedures   MR BRAIN W WO CONTRAST   MR ORBITS W WO CONTRAST   MR CERVICAL SPINE W [facility]   Ambulatory referral to Ophthalmology   No orders of the defined types were placed in this encounter.   Cc: Sharilyn Sites, MD,  Sharilyn Sites, MD  Sarina Ill, MD  [facility] [address] [place],  [address]  Phone [phone] Fax [phone]

## 2022-05-07 NOTE — Patient Instructions (Addendum)
MRI brain and orbits w/wo contrast MS protocol MRI cervical spine w/wo contrast  RTC 3-6 months and decide whether to scan again Ophthalmologist - Dr. Midge Aver   Multiple Sclerosis Multiple sclerosis (MS) is a disease of the brain, spinal cord, and optic nerves (central nervous system). It causes the body's disease-fighting system (immunesystem) to destroy the protective covering around nerves in the brain (myelin sheath). When this happens, signals (nerve impulses) going to and from the brain and spinal cord do not get sent properly or may not get sent at all. There are several types of MS: Relapsing-remitting MS. This is the most common type. This causes sudden attacks of symptoms. After an attack, you may recover completely until the next attack, or some symptoms may remain permanently. Secondary progressive MS. This usually develops after the onset of relapsing-remitting MS. Similar to relapsing-remitting MS, this type also causes sudden attacks of symptoms. Attacks may be less frequent, but symptoms slowly get worse over time. Primary progressive MS. This causes symptoms that steadily progress over time. This type of MS does not cause sudden attacks of symptoms. The age of onset of MS varies, but it often develops between 66 and 39 years of age. MS is a lifelong (chronic) condition. There is no cure, but treatment can help slow down the progression of the disease. What are the causes? The cause of this condition is not known. What increases the risk? You are more likely to develop this condition if: You are a woman. You have a relative with MS. However, the condition is not passed from parent to child (inherited). You have a lack (deficiency) of vitamin D. You smoke. MS is more common in the [place] than in the [place]. What are the signs or symptoms? Relapsing-remitting and secondary progressive MS cause symptoms to occur in episodes or attacks that may  last weeks to months. There may be long periods between attacks in which there are almost no symptoms. Primary progressive MS causes symptoms to steadily progress after they develop. Symptoms of MS vary because of the many different ways it affects the central nervous system. The main symptoms include: Vision problems and eye pain. Numbness and weakness. Inability to move your arms, hands, feet, or legs (paralysis). Balance problems. Shaking that you cannot control (tremors). Sudden muscle tightening (spasms). Problems with thinking (cognitive changes). MS can also cause symptoms that are associated with the disease but are not always the direct result of an MS attack. They may include: Inability to control when you urinate or have bowel movements (incontinence). Headaches. Fatigue. Inability to tolerate heat. Emotional changes. Depression. Pain. How is this diagnosed? This condition is diagnosed based on: Your symptoms. A neurological exam. This involves checking your central nervous system function, such as nerve function, reflexes, and coordination. MRIs of the brain and spinal cord. Lab tests, including a lumbar puncture that tests the fluid that surrounds the brain and spinal cord (cerebrospinal fluid). Tests to measure the electrical activity of the brain in response to stimulation (evoked potentials). How is this treated? There is no cure for MS, but medicines can help decrease the number and frequency of attacks and help relieve nuisance symptoms. Treatment options may include: Medicines that: Reduce the frequency of attacks. These medicines may be given by injection, by mouth (orally), or through an IV. Reduce inflammation (steroids). These may provide short-term relief of symptoms. Help control pain, depression, fatigue, or incontinence. Nutritional counseling. Eating a healthy, balanced diet can help  with symptoms. Taking vitamin D supplements, if you have a  deficiency. Using devices to help you move around (assistive devices), such as braces, a cane, or a walker. Therapy, such as: Physical therapy to strengthen and stretch your muscles. Occupational therapy to help you with everyday tasks. Alternative or complementary treatments such as massage or acupuncture. Low-impact, mild exercises, such as swimming, walking, and yoga. Regular exercise can help alleviate symptoms and increase strength and balance. Follow these instructions at home: Medicines Take over-the-counter and prescription medicines only as told by your health care provider. Ask your health care provider if the medicine prescribed to you requires you to avoid driving or using machinery. Activity Use assistive devices as recommended by your physical therapist or your health care provider. Exercise as directed by your health care provider. Return to your normal activities as told by your health care provider. Ask your health care provider what activities are safe for you. General instructions Eating healthy can help manage MS symptoms. Reach out for support. Share your feelings with friends, family, or a support group. Keep all follow-up visits. This is important. Where to find more information National Multiple Sclerosis Society: www.nationalmssociety.[place] of Neurological Disorders and Stroke: MasterBoxes.it National Center for Complementary and Integrative Health: https://aguirre-king.net/ Contact a health care provider if: You feel depressed. You develop new pain or numbness. You have tremors. You have problems with sexual function. Get help right away if: You develop paralysis. You develop numbness. You have problems with your bladder or bowel function. You develop double vision. You lose vision in one or both eyes. You develop suicidal thoughts. You develop severe confusion. Get help right away if you feel like you may hurt yourself or others, or have  thoughts about taking your own life. Go to your nearest emergency room or: Call 911. Call the [facility] at (561)[phone] or 988. This is open 24 hours a day. Text the Crisis Text Line at 863-883-8445. Summary Multiple sclerosis (MS) is a disease of the central nervous system that causes the body's immune system to destroy the protective covering around nerves in the brain (myelin sheath). There are 3 types of MS: relapsing-remitting, secondary progressive, and primary progressive. There is no cure for MS, but medicines can help decrease the number and frequency of attacks and help relieve nuisance symptoms. Treatment may also include physical or occupational therapy. If you develop numbness, paralysis, vision problems, or other neurological symptoms, get help right away. This information is not intended to replace advice given to you by your health care provider. Make sure you discuss any questions you have with your health care provider. Document Revised: 11/22/2020 Document Reviewed: 11/22/2020 Elsevier Patient Education  [place].

## 2022-05-08 ENCOUNTER — Telehealth: Payer: Self-pay | Admitting: Neurology

## 2022-05-08 NOTE — Telephone Encounter (Signed)
Referral for Ophthalmology fax to [facility]. Phone: [phone], Fax: [phone] 

## 2022-05-08 NOTE — Telephone Encounter (Signed)
[employer] sent to GI they obtain auth [id number] 

## 2022-05-20 ENCOUNTER — Ambulatory Visit: Payer: Self-pay | Admitting: Neurology

## 2022-06-05 ENCOUNTER — Ambulatory Visit
Admission: RE | Admit: 2022-06-05 | Discharge: 2022-06-05 | Disposition: A | Discharge status: Home / Self Care | Payer: Managed Care, Other (non HMO) | Source: Ambulatory Visit | Attending: Neurology

## 2022-06-05 DIAGNOSIS — G1229 Other motor neuron disease: Secondary | ICD-10-CM

## 2022-06-05 DIAGNOSIS — H499 Unspecified paralytic strabismus: Secondary | ICD-10-CM | POA: Diagnosis not present

## 2022-06-05 DIAGNOSIS — R258 Other abnormal involuntary movements: Secondary | ICD-10-CM

## 2022-06-05 DIAGNOSIS — H4720 Unspecified optic atrophy: Secondary | ICD-10-CM

## 2022-06-05 DIAGNOSIS — R200 Anesthesia of skin: Secondary | ICD-10-CM

## 2022-06-05 DIAGNOSIS — R519 Headache, unspecified: Secondary | ICD-10-CM

## 2022-06-05 DIAGNOSIS — H532 Diplopia: Secondary | ICD-10-CM

## 2022-06-05 DIAGNOSIS — R42 Dizziness and giddiness: Secondary | ICD-10-CM

## 2022-06-05 DIAGNOSIS — G379 Demyelinating disease of central nervous system, unspecified: Secondary | ICD-10-CM

## 2022-06-05 DIAGNOSIS — R270 Ataxia, unspecified: Secondary | ICD-10-CM

## 2022-06-05 DIAGNOSIS — R292 Abnormal reflex: Secondary | ICD-10-CM

## 2022-06-05 DIAGNOSIS — R5383 Other fatigue: Secondary | ICD-10-CM

## 2022-06-05 DIAGNOSIS — H4911 Fourth [trochlear] nerve palsy, right eye: Secondary | ICD-10-CM

## 2022-06-05 DIAGNOSIS — R531 Weakness: Secondary | ICD-10-CM

## 2022-06-05 MED ORDER — GADOPICLENOL 0.5 MMOL/ML IV SOLN
10.0000 mL | Freq: Once | INTRAVENOUS | Status: AC | PRN
  Administered 2022-06-05: 10 mL via INTRAVENOUS

## 2022-06-07 ENCOUNTER — Other Ambulatory Visit: Payer: PRIVATE HEALTH INSURANCE

## 2022-06-07 ENCOUNTER — Ambulatory Visit
Admission: RE | Admit: 2022-06-07 | Discharge: 2022-06-07 | Disposition: A | Discharge status: Home / Self Care | Payer: PRIVATE HEALTH INSURANCE | Source: Ambulatory Visit | Attending: Neurology | Admitting: Neurology

## 2022-06-07 DIAGNOSIS — R42 Dizziness and giddiness: Secondary | ICD-10-CM

## 2022-06-07 DIAGNOSIS — R258 Other abnormal involuntary movements: Secondary | ICD-10-CM

## 2022-06-07 DIAGNOSIS — H4911 Fourth [trochlear] nerve palsy, right eye: Secondary | ICD-10-CM

## 2022-06-07 DIAGNOSIS — G1229 Other motor neuron disease: Secondary | ICD-10-CM

## 2022-06-07 DIAGNOSIS — H499 Unspecified paralytic strabismus: Secondary | ICD-10-CM

## 2022-06-07 DIAGNOSIS — H4720 Unspecified optic atrophy: Secondary | ICD-10-CM

## 2022-06-07 DIAGNOSIS — R270 Ataxia, unspecified: Secondary | ICD-10-CM

## 2022-06-07 DIAGNOSIS — R200 Anesthesia of skin: Secondary | ICD-10-CM

## 2022-06-07 DIAGNOSIS — R531 Weakness: Secondary | ICD-10-CM

## 2022-06-07 DIAGNOSIS — R292 Abnormal reflex: Secondary | ICD-10-CM

## 2022-06-07 DIAGNOSIS — H532 Diplopia: Secondary | ICD-10-CM

## 2022-06-07 DIAGNOSIS — G379 Demyelinating disease of central nervous system, unspecified: Secondary | ICD-10-CM

## 2022-06-07 DIAGNOSIS — R519 Headache, unspecified: Secondary | ICD-10-CM

## 2022-06-07 DIAGNOSIS — R5383 Other fatigue: Secondary | ICD-10-CM

## 2022-06-07 MED ORDER — GADOPICLENOL 0.5 MMOL/ML IV SOLN
10.0000 mL | Freq: Once | INTRAVENOUS | Status: AC | PRN
  Administered 2022-06-07: 10 mL via INTRAVENOUS

## 2022-06-11 ENCOUNTER — Encounter: Payer: Self-pay | Admitting: Neurology

## 2022-07-18 ENCOUNTER — Ambulatory Visit: Payer: PRIVATE HEALTH INSURANCE | Admitting: Neurology

## 2022-09-10 ENCOUNTER — Telehealth: Payer: Self-pay | Admitting: Neurology

## 2022-09-10 ENCOUNTER — Ambulatory Visit (INDEPENDENT_AMBULATORY_CARE_PROVIDER_SITE_OTHER): Payer: Self-pay | Admitting: Neurology

## 2022-09-10 ENCOUNTER — Other Ambulatory Visit: Payer: Self-pay | Admitting: Neurology

## 2022-09-10 DIAGNOSIS — Z91199 Patient's noncompliance with other medical treatment and regimen due to unspecified reason: Secondary | ICD-10-CM

## 2022-09-10 NOTE — Telephone Encounter (Signed)
Please do not schedule appointment until speaking with me thanks she has no showed twice and cancelled others.

## 2022-09-10 NOTE — Progress Notes (Signed)
This is patient's second no show. She has also canceled multiple times. Please do not reschedule until you speak with Dr. Lucia Gaskins

## 2023-09-23 ENCOUNTER — Encounter (HOSPITAL_BASED_OUTPATIENT_CLINIC_OR_DEPARTMENT_OTHER): Payer: Self-pay | Admitting: Internal Medicine

## 2023-09-23 DIAGNOSIS — G4710 Hypersomnia, unspecified: Secondary | ICD-10-CM

## 2023-09-23 DIAGNOSIS — R5383 Other fatigue: Secondary | ICD-10-CM

## 2023-09-23 DIAGNOSIS — R0683 Snoring: Secondary | ICD-10-CM

## 2023-09-25 ENCOUNTER — Encounter (HOSPITAL_BASED_OUTPATIENT_CLINIC_OR_DEPARTMENT_OTHER): Admitting: Internal Medicine

## 2023-11-04 NOTE — Procedures (Signed)
 Orders only

## 2023-11-13 ENCOUNTER — Ambulatory Visit (HOSPITAL_BASED_OUTPATIENT_CLINIC_OR_DEPARTMENT_OTHER): Attending: Family Medicine | Admitting: Internal Medicine

## 2023-11-13 DIAGNOSIS — R0683 Snoring: Secondary | ICD-10-CM

## 2023-11-13 DIAGNOSIS — G4733 Obstructive sleep apnea (adult) (pediatric): Secondary | ICD-10-CM | POA: Diagnosis not present

## 2023-11-13 DIAGNOSIS — G4710 Hypersomnia, unspecified: Secondary | ICD-10-CM

## 2023-11-13 DIAGNOSIS — R5383 Other fatigue: Secondary | ICD-10-CM

## 2023-11-23 NOTE — Procedures (Signed)
 Darryle Law [facility] [address] [place], [place] [address] Tel: [phone]   Fax: [phone]  Polysomnography Interpretation  Patient Name:  Sheila Barrett, Sheila Barrett Date:  11/13/2023 Referring Physician:  SAMANTHA JACKSON [phone]) %%startinterp%% Indications for Polysomnography The patient is a 48 year-old Female who is 5' 3 and weighs 544.5 lbs. Her BMI equals 96.5.  A full night polysomnogram was performed to evaluate for -.OSA  Medication  No Data.   Polysomnogram Data A full night polysomnogram recorded the standard physiologic parameters including EEG, EOG, EMG, EKG, nasal and oral airflow.  Respiratory parameters of chest and abdominal movements were recorded with Respiratory Inductance Plethysmography belts.  Oxygen saturation was recorded by pulse oximetry.   Sleep Architecture The total recording time of the polysomnogram was 421.8 minutes.  The total sleep time was 389.0 minutes.  The patient spent 1.8% of total sleep time in Stage N1, 83.2% in Stage N2, 6.8% in Stages N3, and 8.2% in REM.  Sleep latency was 18.5 minutes.  REM latency was 181.0 minutes.  Sleep Efficiency was 92.2%.  Wake after Sleep Onset time was 14.5 minutes.  Respiratory Events The polysomnogram revealed a presence of - obstructive, - central, and - mixed apneas resulting in an Apnea index of - events per hour.  There were 49 hypopneas (>=3% desaturation and/or arousal) resulting in an Apnea\Hypopnea Index (AHI >=3% desaturation and/or arousal) of 7.6 events per hour.  There were 22 hypopneas (>=4% desaturation) resulting in an Apnea\Hypopnea Index (AHI >=4% desaturation) of 3.4 events per hour.  There were - Respiratory Effort Related Arousals resulting in a RERA index of - events per hour. The Respiratory Disturbance Index is 7.6 events per hour.  The snore index was 410.9 events per hour.  Mean oxygen saturation was 94.8%.  The lowest oxygen saturation during sleep was  89.0%.  Time spent <=88% oxygen saturation was - minutes (-).  Limb Activity There were 15 total limb movements recorded, of this total, - were classified as PLMs.  PLM index was - per hour and PLM associated with Arousals index was - per hour.  Cardiac Summary The average pulse rate was 78.6 bpm.  The minimum pulse rate was 55.0 bpm while the maximum pulse rate was 110.0 bpm.  Cardiac rhythm was normal/abnormal.  Comments: Mild obstructive sleep apnea, AHI (3%) 7.6/hr. Loud snoring with oxygen desaturation to nadir 89%, mean 94.8%.  Diagnosis: Obstructive sleep apnea  Recommendations: Suggest autopap 5-15, CPAP titration sleep study or fitted oral appliance.   This study was personally reviewed and electronically signed by: Reggy Salt MD Accredited Board Certified in Sleep Medicine Date/Time: 11/23/23   11:27    %%endinterp%%   Diagnostic PSG Report  Patient Name: Sheila Barrett, Sheila Barrett Date: 11/13/2023  Date of Birth: 10-05-1975 Study Type: Diagnostic  Age: 48 year MRN #: [id number]  Sex: Female Interpreting Physician: SALT REGGY, [id number]  Height: 5' 3 Referring Physician: LUCIE MACE [id number])  Weight: 544.5 lbs Recording Tech: Prentice Coombe RPSGT RST  BMI: 96.5 Scoring Tech: Prentice Coombe RPSGT RST  ESS: 6 Neck Size: 15   Study Overview  Lights Off: 09:14:41 PM  Count Index  Lights On: 04:16:28 AM Awakenings: 13 2.0  Time in Bed: 421.8 min. Arousals: 15 2.3  Total Sleep Time: 389.0 min. AHI (>=3% Desat and/or Ar.): 49 7.6   Sleep Efficiency: 92.2% AHI (>=4% Desat): 22 3.4   Sleep Latency: 18.5 min. Limb Movements: 15 2.3  Wake After Sleep Onset:  14.5 min. Snore: 2664 410.9  REM Latency from Sleep Onset: 181.0 min. Desaturations: 57 8.8     Minimum SpO2 TST: 89.0%    Sleep Architecture  % of Time in Bed Stages Time (mins) % Sleep Time  Wake 33.0   Stage N1 7.0 1.8%  Stage N2 323.5 83.2%  Stage N3 26.5 6.8%  REM 32.0 8.2%   Arousal Summary   NREM REM Sleep  Index  Respiratory Arousals 1 - 1 0.2  PLM Arousals - - - -  Isolated Limb Movement Arousals 1 - 1 0.2  Snore Arousals 4 - 4 0.6  Spontaneous Arousals 9 - 9 1.4  Total 15 - 15 2.3   Limb Movement Summary   Count Index  Isolated Limb Movements 15 2.3  Periodic Limb Movements (PLMs) - -  Total Limb Movements 15 2.3    Respiratory Summary   By Sleep Stage By Body Position Total   NREM REM Supine Non-Supine   Time (min) 357.0 32.0 146.5 242.5 389.0         Obstructive Apnea - - - - -  Mixed Apnea - - - - -  Central Apnea - - - - -  Total Apneas - - - - -  Total Apnea Index - - - - -         Hypopneas (>=3% Desat and/or Ar.) 40 9 17 32 49  AHI (>=3% Desat and/or Ar.) 6.7 16.9 7.0 7.9 7.6         Hypopneas (>=4% Desat) 18 4 11 11 22   AHI (>=4% Desat) 3.0 7.5 4.5 2.7 3.4          RERAs - - - - -  RERA Index - - - - -         RDI 6.7 16.9 7.0 7.9 7.6    Respiratory Event Type Index  Central Apneas -  Obstructive Apneas -  Mixed Apneas -  Central Hypopneas -  Obstructive Hypopneas 7.6  Central Apnea + Hypopnea (CAHI) -  Obstructive Apnea + Hypopnea (OAHI) 7.6   Respiratory Event Durations   Apnea Hypopnea   NREM REM NREM REM  Average (seconds) - - 30.2 31.0  Maximum (seconds) - - 66.2 44.1    Oxygen Saturation Summary   Wake NREM REM TST TIB  Average SpO2 (%) 96.4% 94.7% 94.6% 94.7% 94.8%  Minimum SpO2 (%) 92.0% 89.0% 89.0% 89.0% 89.0%  Maximum SpO2 (%) 99.0% 98.0% 98.0% 98.0% 99.0%   Oxygen Saturation Distribution  Range (%) Time in range (min) Time in range (%)  90.0 - 100.0 420.1 99.9%  80.0 - 90.0 0.3 0.1%  70.0 - 80.0 - -  60.0 - 70.0 - -  50.0 - 60.0 - -  0.0 - 50.0 - -  Time Spent <=88% SpO2  Range (%) Time in range (min) Time in range (%)  0.0 - 88.0 - -      Count Index  Desaturations 57 8.8    Cardiac Summary   Wake NREM REM Sleep Total  Average Pulse Rate (BPM) 79.6 78.8 75.4 78.5 78.6  Minimum Pulse Rate (BPM) 61.0 55.0 64.0  55.0 55.0  Maximum Pulse Rate (BPM) 106.0 110.0 89.0 110.0 110.0   Pulse Rate Distribution:  Range (bpm) Time in range (min) Time in range (%)  0.0 - 40.0 - -  40.0 - 60.0 0.9 0.2%  60.0 - 80.0 262.8 62.5%  80.0 - 100.0 156.1 37.1%  100.0 - 120.0 0.7 0.2%  120.0 -  140.0 - -  140.0 - 200.0 - -      Hypnograms                      Technologist Comments  STUDY TYPE= PSG ROOM# 3 DATE= 11/13/2023  Associated Diagnoses was suspected OSA and Loud Snoring. Baseline study was ordered. Patient arrived early for this study. Hook up was smooth. Patient had difficulty falling asleep. Tech observed Mild Apnea as the study Progressed through the night. Patient was restless and Snores very Loud and Heavy. No significant  PLMs.  No Bathroom break. Patient did not take medication at the lab. all stages of sleep were met. Patient tossed and turned all night. other sleep symptoms includes waking up fatigued. Snoring loud and continues. Body or Leg Jerk. Split was not initiated due to PSG order. ECG appeared to be normal sinus rhythm.                           Reggy Salt Diplomate, Biomedical engineer of Sleep Medicine  ELECTRONICALLY SIGNED ON:  11/23/2023, 11:21 AM [facility] PH: [phone]   FX: [phone] ACCREDITED BY THE [employer]ICAN ACADEMY OF SLEEP MEDICINE

## 2023-11-23 NOTE — Procedures (Signed)
  Indications for Polysomnography The patient is a 48 year-old Female who is 5' 3 and weighs 544.5 lbs. Her BMI equals 96.5.  A full night polysomnogram was performed to evaluate for -.  MedicationNo Data. Polysomnogram Data A full night polysomnogram recorded the standard physiologic parameters including EEG, EOG, EMG, EKG, nasal and oral airflow.  Respiratory parameters of chest and abdominal movements were recorded with Respiratory Inductance Plethysmography belts.   Oxygen saturation was recorded by pulse oximetry.  Sleep Architecture The total recording time of the polysomnogram was 421.8 minutes.  The total sleep time was 389.0 minutes.  The patient spent 1.8% of total sleep time in Stage N1, 83.2% in Stage N2, 6.8% in Stages N3, and 8.2% in REM.  Sleep latency was 18.5 minutes.   REM latency was 181.0 minutes.  Sleep Efficiency was 92.2%.  Wake after Sleep Onset time was 14.5 minutes.  Respiratory Events The polysomnogram revealed a presence of - obstructive, - central, and - mixed apneas resulting in an Apnea index of - events per hour.  There were 49 hypopneas (GreaterEqual to3% desaturation and/or arousal) resulting in an Apnea\Hypopnea Index (AHI  GreaterEqual to3% desaturation and/or arousal) of 7.6 events per hour.  There were 22 hypopneas (GreaterEqual to4% desaturation) resulting in an Apnea\Hypopnea Index (AHI GreaterEqual to4% desaturation) of 3.4 events per hour.  There were - Respiratory  Effort Related Arousals resulting in a RERA index of - events per hour. The Respiratory Disturbance Index is 7.6 events per hour.  The snore index was 410.9 events per hour.  Mean oxygen saturation was 94.8%.  The lowest oxygen saturation during sleep was 89.0%.  Time spent LessEqual to88% oxygen saturation was  minutes ().  Limb Activity There were 15 total limb movements recorded, of this total, - were classified as PLMs.  PLM index was - per hour and PLM associated with Arousals index was  - per hour.  Cardiac Summary The average pulse rate was 78.6 bpm.  The minimum pulse rate was 55.0 bpm while the maximum pulse rate was 110.0 bpm.  Cardiac rhythm was normal/abnormal.  Comments:  Diagnosis:  Recommendations:   This study was personally reviewed and electronically signed by: Reggy Salt MD Accredited Board Certified in Sleep Medicine Date/Time:
